# Patient Record
Sex: Male | Born: 1975 | Race: Black or African American | Hispanic: No | Marital: Single | Smoking: Current every day smoker
Health system: Southern US, Community
[De-identification: ages and names within clinical notes are randomized; demographics above are authoritative.]

## PROBLEM LIST (undated history)

## (undated) DIAGNOSIS — H269 Unspecified cataract: Secondary | ICD-10-CM

## (undated) DIAGNOSIS — M109 Gout, unspecified: Secondary | ICD-10-CM

## (undated) HISTORY — PX: EYE SURGERY: SHX253

## (undated) HISTORY — DX: Unspecified cataract: H26.9

## (undated) HISTORY — DX: Gout, unspecified: M10.9

---

## 2001-09-22 ENCOUNTER — Emergency Department (HOSPITAL_COMMUNITY): Admission: EM | Admit: 2001-09-22 | Discharge: 2001-09-22 | Payer: Self-pay | Admitting: Emergency Medicine

## 2012-06-09 ENCOUNTER — Ambulatory Visit: Payer: 59

## 2012-06-09 ENCOUNTER — Ambulatory Visit (INDEPENDENT_AMBULATORY_CARE_PROVIDER_SITE_OTHER): Payer: 59 | Admitting: Family Medicine

## 2012-06-09 VITALS — BP 101/69 | HR 84 | Temp 98.1°F | Resp 16 | Ht 65.25 in | Wt 234.0 lb

## 2012-06-09 DIAGNOSIS — R1012 Left upper quadrant pain: Secondary | ICD-10-CM

## 2012-06-09 DIAGNOSIS — M109 Gout, unspecified: Secondary | ICD-10-CM

## 2012-06-09 DIAGNOSIS — K5900 Constipation, unspecified: Secondary | ICD-10-CM

## 2012-06-09 DIAGNOSIS — R1084 Generalized abdominal pain: Secondary | ICD-10-CM

## 2012-06-09 LAB — POCT UA - MICROSCOPIC ONLY
Bacteria, U Microscopic: NEGATIVE
Casts, Ur, LPF, POC: NEGATIVE
Crystals, Ur, HPF, POC: NEGATIVE
Mucus, UA: NEGATIVE
Yeast, UA: NEGATIVE

## 2012-06-09 LAB — POCT URINALYSIS DIPSTICK
Bilirubin, UA: NEGATIVE
Glucose, UA: NEGATIVE
Ketones, UA: NEGATIVE
Nitrite, UA: NEGATIVE
Protein, UA: NEGATIVE
Spec Grav, UA: 1.015
Urobilinogen, UA: 0.2
pH, UA: 6

## 2012-06-09 LAB — POCT CBC
Granulocyte percent: 58.3 %G (ref 37–80)
HCT, POC: 55.5 % — AB (ref 43.5–53.7)
Hemoglobin: 18 g/dL (ref 14.1–18.1)
Lymph, poc: 2.4 (ref 0.6–3.4)
MCH, POC: 29.6 pg (ref 27–31.2)
MCHC: 32.4 g/dL (ref 31.8–35.4)
MCV: 91.3 fL (ref 80–97)
MID (cbc): 0.6 (ref 0–0.9)
MPV: 7.9 fL (ref 0–99.8)
POC Granulocyte: 4.2 (ref 2–6.9)
POC LYMPH PERCENT: 33.9 %L (ref 10–50)
POC MID %: 7.8 %M (ref 0–12)
Platelet Count, POC: 417 10*3/uL (ref 142–424)
RBC: 6.08 M/uL (ref 4.69–6.13)
RDW, POC: 13.8 %
WBC: 7.2 10*3/uL (ref 4.6–10.2)

## 2012-06-09 LAB — COMPREHENSIVE METABOLIC PANEL
ALT: 29 U/L (ref 0–53)
AST: 15 U/L (ref 0–37)
Albumin: 4.6 g/dL (ref 3.5–5.2)
Alkaline Phosphatase: 56 U/L (ref 39–117)
BUN: 8 mg/dL (ref 6–23)
CO2: 29 mEq/L (ref 19–32)
Calcium: 10.3 mg/dL (ref 8.4–10.5)
Chloride: 102 mEq/L (ref 96–112)
Creat: 1.09 mg/dL (ref 0.50–1.35)
Glucose, Bld: 91 mg/dL (ref 70–99)
Potassium: 4.5 mEq/L (ref 3.5–5.3)
Sodium: 139 mEq/L (ref 135–145)
Total Bilirubin: 0.6 mg/dL (ref 0.3–1.2)
Total Protein: 7.6 g/dL (ref 6.0–8.3)

## 2012-06-09 MED ORDER — HYOSCYAMINE SULFATE 0.125 MG SL SUBL: 0.1250 mg | SUBLINGUAL_TABLET | SUBLINGUAL | Status: DC | PRN

## 2012-06-09 MED ORDER — LACTULOSE 10 GM/15ML PO SOLN: 20.0000 g | Freq: Three times a day (TID) | ORAL | Status: AC

## 2012-06-09 NOTE — Progress Notes (Signed)
36 yo man with left upper quadrant pain x 3 days.  He tried some OTC liquid which did not help.   Had some nausea and vomiting at first. No prior h/o abdominal pain.  Does have constipation. Works at [employer]. Recently had cataract surgery on eye (Tuesday)  [pain in abdomen started later]  Objective:  NAD HEENT:  Right eye is patched Abdomen:  Obese, no HSM detected, mildly tender LUQ Chest: clear Heart: reg, no murmur [facility] reading (PRIMARY) by  Dr. Milus Glazier:  nonsp bowel gas pattern Results for orders placed in visit on 06/09/12  POCT UA - MICROSCOPIC ONLY      Component Value Range   WBC, Ur, HPF, POC 0-1     RBC, urine, microscopic 0-2     Bacteria, U Microscopic negative     Mucus, UA negative     Epithelial cells, urine per micros 0-2     Crystals, Ur, HPF, POC negative     Casts, Ur, LPF, POC negative     Yeast, UA negative    POCT CBC      Component Value Range   WBC 7.2  4.6 - 10.2 K/uL   Lymph, poc 2.4  0.6 - 3.4   POC LYMPH PERCENT 33.9  10 - 50 %L   MID (cbc) 0.6  0 - 0.9   POC MID % 7.8  0 - 12 %M   POC Granulocyte 4.2  2 - 6.9   Granulocyte percent 58.3  37 - 80 %G   RBC 6.08  4.69 - 6.13 M/uL   Hemoglobin 18.0  14.1 - 18.1 g/dL   HCT, POC 16.1 (*) 09.6 - 53.7 %   MCV 91.3  80 - 97 fL   MCH, POC 29.6  27 - 31.2 pg   MCHC 32.4  31.8 - 35.4 g/dL   RDW, POC 04.5     Platelet Count, POC 417  142 - 424 K/uL   MPV 7.9  0 - 99.8 fL    Assessment:  Obstipation Plan:   1. LUQ abdominal pain  DG Abd 1 View, POCT UA - Microscopic Only, POCT urinalysis dipstick, POCT CBC, Comprehensive metabolic panel, H. pylori antibody, IgG  2. Gout    3. Obstipation  hyoscyamine (LEVSIN/SL) 0.125 MG SL tablet, lactulose (CHRONULAC) 10 GM/15ML solution

## 2012-06-12 ENCOUNTER — Other Ambulatory Visit: Payer: Self-pay | Admitting: Family Medicine

## 2012-06-12 DIAGNOSIS — K279 Peptic ulcer, site unspecified, unspecified as acute or chronic, without hemorrhage or perforation: Secondary | ICD-10-CM

## 2012-06-12 LAB — H. PYLORI ANTIBODY, IGG: H Pylori IgG: 6.24 {ISR} — ABNORMAL HIGH

## 2012-06-12 MED ORDER — AMOXICILL-CLARITHRO-LANSOPRAZ PO MISC: Freq: Two times a day (BID) | ORAL | Status: DC

## 2012-07-13 ENCOUNTER — Ambulatory Visit (INDEPENDENT_AMBULATORY_CARE_PROVIDER_SITE_OTHER): Payer: 59 | Admitting: Physician Assistant

## 2012-07-13 VITALS — BP 132/98 | HR 88 | Temp 98.3°F | Resp 16 | Ht 65.5 in | Wt 241.0 lb

## 2012-07-13 DIAGNOSIS — M109 Gout, unspecified: Secondary | ICD-10-CM

## 2012-07-13 DIAGNOSIS — R52 Pain, unspecified: Secondary | ICD-10-CM

## 2012-07-13 MED ORDER — COLCHICINE 0.6 MG PO TABS: ORAL_TABLET | ORAL | Status: DC

## 2012-07-13 MED ORDER — PREDNISONE 20 MG PO TABS: ORAL_TABLET | ORAL | Status: AC

## 2012-07-13 NOTE — Progress Notes (Signed)
   Patient ID: Richard Berger MRN: [id number], DOB: June 11, 1976, 36 y.o. Date of Encounter: 07/13/2012, 10:09 AM  Primary Physician: Tally Due, MD  Chief Complaint: Gout flare  HPI: 36 y.o. year old male with history below presents with pain along the left great toe for 3 days. No injury or trauma. Symptoms began on 07/10/12. Patient with significant history of gout. Has been seen here numerous times for this. Has been off of his Allopurinol for about 6-8 months and had been doing well. He stopped eating beef all together. He did recently eat some shellfish along with some beer though. No radiation distally or proximally. Afebrile. No chills.    Past Medical History  Diagnosis Date  . Gout      Home Meds: Prior to Admission medications   Medication Sig Start Date End Date Taking? Authorizing Provider  None                Allergies: No Known Allergies  History   Social History  . Marital Status: Single    Spouse Name: N/A    Number of Children: N/A  . Years of Education: N/A   Occupational History  . Not on file.   Social History Main Topics  . Smoking status: Current Some Day Smoker    Types: Cigarettes  . Smokeless tobacco: Not on file  . Alcohol Use: Not on file  . Drug Use: Not on file  . Sexually Active: Not on file   Other Topics Concern  . Not on file   Social History Narrative  . No narrative on file     Review of Systems: Constitutional: negative for chills, fever, night sweats, or weight changes  HEENT: negative for vision changes or hearing loss Cardiovascular: negative for chest pain or palpitations Respiratory: negative for hemoptysis, wheezing, shortness of breath, or cough Abdominal: negative for abdominal pain, nausea, or vomiting Dermatological: see above Neurologic: negative for headache, dizziness, or syncope   Physical Exam: Blood pressure 132/98, pulse 88, temperature 98.3 F (36.8 C), temperature source Oral, resp. rate 16, height  5' 5.5" (1.664 m), weight 241 lb (109.317 kg), SpO2 99.00%., Body mass index is 39.49 kg/(m^2). General: Well developed, well nourished, in no acute distress. Head: Normocephalic, atraumatic, eyes without discharge, sclera non-icteric, nares are without discharge. Bilateral auditory canals clear, TM's are without perforation, pearly grey and translucent with reflective cone of light bilaterally. Oral cavity moist, posterior pharynx without exudate, erythema, peritonsillar abscess, or post nasal drip.  Neck: Supple. No thyromegaly. Full ROM. No lymphadenopathy. Lungs: Clear bilaterally to auscultation without wheezes, rales, or rhonchi. Breathing is unlabored. Heart: RRR with S1 S2. No murmurs, rubs, or gallops appreciated. Msk:  Strength and tone normal for age. Extremities/Skin: Left great toe erythematous and swollen with considerable TTP. Distal pulses 2+ and cap refill less than 2 seconds through out. Warm and dry. No clubbing or cyanosis. No edema. No rashes, wounds, or suspicious lesions. Neuro: Alert and oriented X 3. Moves all extremities spontaneously. Gait is normal. CNII-XII grossly in tact. Psych:  Responds to questions appropriately with a normal affect.     ASSESSMENT AND PLAN:  36 y.o. year old male with gout of left great toe. -Prednisone 20 mg #18 3x3, 2x3, 1x3 no RF -Colcrys 0.6 mg 2 po now, then 1 po 1 hour later #6 RF 1 -Diet discussed -RTC precautions  Signed, Eula Listen, PA-C 07/13/2012 10:09 AM

## 2012-08-10 ENCOUNTER — Ambulatory Visit: Payer: 59

## 2012-08-10 ENCOUNTER — Ambulatory Visit (INDEPENDENT_AMBULATORY_CARE_PROVIDER_SITE_OTHER): Payer: 59 | Admitting: Emergency Medicine

## 2012-08-10 VITALS — BP 122/76 | HR 84 | Temp 98.4°F | Resp 17 | Ht 65.0 in | Wt 239.0 lb

## 2012-08-10 DIAGNOSIS — K5900 Constipation, unspecified: Secondary | ICD-10-CM

## 2012-08-10 DIAGNOSIS — R109 Unspecified abdominal pain: Secondary | ICD-10-CM

## 2012-08-10 NOTE — Patient Instructions (Addendum)
Constipation, Adult  Constipation is when a person:   Poops (bowel movement) less than 3 times a week.   Has a hard time pooping.   Has poop that is dry, hard, or bigger than normal.  HOME CARE    Eat more fiber, such as fruits, vegetables, whole grains like brown rice, and beans.   Eat less fatty foods and sugar. This includes French fries, hamburgers, cookies, candy, and soda.   If you are not getting enough fiber from food, take products with added fiber in them (supplements).   Drink enough fluid to keep your pee (urine) clear or pale yellow.   Go to the restroom when you feel like you need to poop. Do not hold it.   Only take medicine as told by your doctor. Do not take medicines that help you poop (laxatives) without talking to your doctor first.   Exercise on a regular basis, or as told by your doctor.  GET HELP RIGHT AWAY IF:    You have bright red blood in your poop (stool).   Your constipation lasts more than 4 days or gets worse.   You have belly (abdomen) or butt (rectal) pain.   You have thin poop (as thin as a pencil).   You lose weight, and it cannot be explained.  MAKE SURE YOU:    Understand these instructions.   Will watch your condition.   Will get help right away if you are not doing well or get worse.  Document Released: 04/12/2008 Document Revised: 01/17/2012 Document Reviewed: 09/28/2011  [employer] Patient Information 2013 [employer].

## 2012-08-10 NOTE — Progress Notes (Signed)
  Subjective:    Patient ID: Richard Berger, male    DOB: 02-10-1976, 36 y.o.   MRN: [id number]  HPI patient presents with a history that he has been unable to have a bowel movement the last 2 weeks. He's been taking MiraLax on a regular basis without success. He has no previous history of constipation this is a new problem for him he has not had in the past.    Review of Systems     Objective:   Physical Exam patient is alert cooperative not in any distress his abdomen is soft there are no areas of tenderness his rectal exam did not reveal any stool in the rectal vault  [facility] reading (PRIMARY) by  Dr. Christeen Douglas has a nonspecific bowel gas pattern with no evidence of obstruction.         Assessment & Plan:   We'll treat with Dulcolax along with Dulcolax suppositories and information given regarding constipation he is going to take 2 Dulcolax tablets now and repeat tonight. If no bowel movement he will take 2 Dulcolax in the morning and 2 tomorrow night. He is to use a Dulcolax suppository now repeat tonight and repeat in the morning and evening tomorrow if he does not get results. Information was given regarding treatment of constipation.

## 2012-08-26 ENCOUNTER — Ambulatory Visit (INDEPENDENT_AMBULATORY_CARE_PROVIDER_SITE_OTHER): Payer: 59 | Admitting: Family Medicine

## 2012-08-26 VITALS — BP 118/70 | HR 102 | Temp 98.2°F | Resp 16 | Ht 65.0 in | Wt 245.0 lb

## 2012-08-26 DIAGNOSIS — R52 Pain, unspecified: Secondary | ICD-10-CM

## 2012-08-26 DIAGNOSIS — M79673 Pain in unspecified foot: Secondary | ICD-10-CM

## 2012-08-26 DIAGNOSIS — M79609 Pain in unspecified limb: Secondary | ICD-10-CM

## 2012-08-26 DIAGNOSIS — M109 Gout, unspecified: Secondary | ICD-10-CM

## 2012-08-26 LAB — COMPREHENSIVE METABOLIC PANEL
ALT: 35 U/L (ref 0–53)
BUN: 10 mg/dL (ref 6–23)
CO2: 26 mEq/L (ref 19–32)
Calcium: 9.5 mg/dL (ref 8.4–10.5)
Chloride: 102 mEq/L (ref 96–112)
Creat: 1.09 mg/dL (ref 0.50–1.35)
Glucose, Bld: 92 mg/dL (ref 70–99)
Total Bilirubin: 0.5 mg/dL (ref 0.3–1.2)

## 2012-08-26 LAB — POCT CBC
Granulocyte percent: 56.1 %G (ref 37–80)
MID (cbc): 0.6 (ref 0–0.9)
MPV: 8 fL (ref 0–99.8)
POC Granulocyte: 4.7 (ref 2–6.9)
POC MID %: 7.5 %M (ref 0–12)
Platelet Count, POC: 385 10*3/uL (ref 142–424)
RBC: 5.68 M/uL (ref 4.69–6.13)

## 2012-08-26 LAB — URIC ACID: Uric Acid, Serum: 9.4 mg/dL — ABNORMAL HIGH (ref 4.0–7.8)

## 2012-08-26 MED ORDER — COLCHICINE 0.6 MG PO TABS: ORAL_TABLET | ORAL | Status: DC

## 2012-08-26 MED ORDER — PREDNISONE 20 MG PO TABS: ORAL_TABLET | ORAL | Status: DC

## 2012-08-26 NOTE — Progress Notes (Signed)
[address]   [place], [place]  [address]   [phone]  Subjective:    Patient ID: Richard Berger, male    DOB: 01/19/1976, 36 y.o.   MRN: [id number]  HPI This 36 y.o. male presents for evaluation of foot pain.  Ate shrimp five days ago; onset of L foot pain three days ago.  Pain located in L ankle.  +swelling; +warm to touch; +hot to touch.  No beer; rare beer intake.  No recent beer; last beer intake 2 weeks ago.  No injury.  Feels like typical gout.  History of gout a long time ago.  No longer eating beef.  Has taken Colcrys x 2 yesterday.  Previously took Allopurinol daily for prevention.  Trying to change diet to prevent recurrence.  Pain feels like typical gouty pain.  Prednisone worked well in 07/2012.  Pain with weight bearing.   PMH:  Gout Psurg: Cataract Surgery Social: single; one son in [place].  Social tobacco; social alcohol.  Moved to [place] in 2002 from [place].     Review of Systems  Constitutional: Negative for fever, chills, diaphoresis and fatigue.  Musculoskeletal: Positive for myalgias, joint swelling and arthralgias.  Skin: Negative for color change and rash.  Neurological: Negative for weakness and numbness.    Past Medical History  Diagnosis Date  . Gout     Past Surgical History  Procedure Date  . Eye surgery     Cataract    Prior to Admission medications   Medication Sig Start Date End Date Taking? Authorizing Provider  colchicine 0.6 MG tablet One po tid for 5 days with onset of gouty pain 08/26/12  Yes Ethelda Chick, MD  predniSONE (DELTASONE) 20 MG tablet 3 tablets daily x 1 day, then two tablets daily x 5 days, then one tablet daily x 5 days 08/26/12   Ethelda Chick, MD    No Known Allergies  History   Social History  . Marital Status: Single    Spouse Name: N/A    Number of Children: N/A  . Years of Education: N/A   Occupational History  . Not on file.   Social History Main Topics  . Smoking status: Current Some Day Smoker     Types: Cigarettes  . Smokeless tobacco: Not on file  . Alcohol Use: 2.4 oz/week    2 Glasses of wine, 2 Cans of beer per week  . Drug Use: Not on file  . Sexually Active: Not on file   Other Topics Concern  . Not on file   Social History Narrative   Marital status: single; dating. Moved from [place] to [place] in 2002.  Family in [place].   Children: one son in [place].   Employment: employed   Tobacco: daily tobacco use.   Alcohol: alcohol on weekends.      No family history on file.     Objective:   Physical Exam  Constitutional: He appears well-developed and well-nourished. No distress.  Cardiovascular: Normal rate, regular rhythm, normal heart sounds and intact distal pulses.   No murmur heard. Musculoskeletal: He exhibits edema and tenderness.       Left foot: He exhibits decreased range of motion, tenderness, bony tenderness and swelling. He exhibits normal capillary refill, no crepitus, no deformity and no laceration.       L FOOT:  MILD TO MODERATE SWELLING; WARMTH ALONG METATARSALS AND TARSAL BONES; +TTP DIFFUSELY TARSALS AND METATARSALS; PAIN WITH ROM  L ANKLE.  FULL ROM TOES DIFFUSELY.    Skin: He is not diaphoretic.   Results for orders placed in visit on 08/26/12  POCT CBC      Component Value Range   WBC 8.3  4.6 - 10.2 K/uL   Lymph, poc 3.0  0.6 - 3.4   POC LYMPH PERCENT 36.4  10 - 50 %L   MID (cbc) 0.6  0 - 0.9   POC MID % 7.5  0 - 12 %M   POC Granulocyte 4.7  2 - 6.9   Granulocyte percent 56.1  37 - 80 %G   RBC 5.68  4.69 - 6.13 M/uL   Hemoglobin 16.8  14.1 - 18.1 g/dL   HCT, POC 40.9  81.1 - 53.7 %   MCV 91.5  80 - 97 fL   MCH, POC 29.6  27 - 31.2 pg   MCHC 32.3  31.8 - 35.4 g/dL   RDW, POC 91.4     Platelet Count, POC 385  142 - 424 K/uL   MPV 8.0  0 - 99.8 fL       Assessment & Plan:   1. Foot pain  POCT CBC, Comprehensive metabolic panel  2. Gout  POCT SEDIMENTATION RATE, Uric Acid    1. L Foot Pain:  New.  Secondary to acute  gouty attack.   2.  Gouty Attack L Foot:  Recurrent/new to this provider.  Rx for Prednisone provided; refill of Colcrys provided and educated on use.  Obtain labs. Pt declined xray and appropriate unless does not improve with current rx.  Post-operative shoe provided and fitted; pt declined crutches.  Advised to avoid seafood and red meat; advised to limit alcohol.

## 2012-08-26 NOTE — Patient Instructions (Addendum)
1. Foot pain  POCT CBC, Comprehensive metabolic panel  2. Gout  POCT SEDIMENTATION RATE, Uric Acid, predniSONE (DELTASONE) 20 MG tablet   Gout Gout is an inflammatory condition (arthritis) caused by a buildup of uric acid crystals in the joints. Uric acid is a chemical that is normally present in the blood. Under some circumstances, uric acid can form into crystals in your joints. This causes joint redness, soreness, and swelling (inflammation). Repeat attacks are common. Over time, uric acid crystals can form into masses (tophi) near a joint, causing disfigurement. Gout is treatable and often preventable. CAUSES  The disease begins with elevated levels of uric acid in the blood. Uric acid is produced by your body when it breaks down a naturally found substance called purines. This also happens when you eat certain foods such as meats and fish. Causes of an elevated uric acid level include:  Being passed down from parent to child (heredity).  Diseases that cause increased uric acid production (obesity, psoriasis, some cancers).  Excessive alcohol use.  Diet, especially diets rich in meat and seafood.  Medicines, including certain cancer-fighting drugs (chemotherapy), diuretics, and aspirin.  Chronic kidney disease. The kidneys are no longer able to remove uric acid well.  Problems with metabolism. Conditions strongly associated with gout include:  Obesity.  High blood pressure.  High cholesterol.  Diabetes. Not everyone with elevated uric acid levels gets gout. It is not understood why some people get gout and others do not. Surgery, joint injury, and eating too much of certain foods are some of the factors that can lead to gout. SYMPTOMS   An attack of gout comes on quickly. It causes intense pain with redness, swelling, and warmth in a joint.  Fever can occur.  Often, only one joint is involved. Certain joints are more commonly involved:  Base of the big  toe.  Knee.  Ankle.  Wrist.  Finger. Without treatment, an attack usually goes away in a few days to weeks. Between attacks, you usually will not have symptoms, which is different from many other forms of arthritis. DIAGNOSIS  Your caregiver will suspect gout based on your symptoms and exam. Removal of fluid from the joint (arthrocentesis) is done to check for uric acid crystals. Your caregiver will give you a medicine that numbs the area (local anesthetic) and use a needle to remove joint fluid for exam. Gout is confirmed when uric acid crystals are seen in joint fluid, using a special microscope. Sometimes, blood, urine, and X-ray tests are also used. TREATMENT  There are 2 phases to gout treatment: treating the sudden onset (acute) attack and preventing attacks (prophylaxis). Treatment of an Acute Attack  Medicines are used. These include anti-inflammatory medicines or steroid medicines.  An injection of steroid medicine into the affected joint is sometimes necessary.  The painful joint is rested. Movement can worsen the arthritis.  You may use warm or cold treatments on painful joints, depending which works best for you.  Discuss the use of coffee, vitamin C, or cherries with your caregiver. These may be helpful treatment options. Treatment to Prevent Attacks After the acute attack subsides, your caregiver may advise prophylactic medicine. These medicines either help your kidneys eliminate uric acid from your body or decrease your uric acid production. You may need to stay on these medicines for a very long time. The early phase of treatment with prophylactic medicine can be associated with an increase in acute gout attacks. For this reason, during the first few  months of treatment, your caregiver may also advise you to take medicines usually used for acute gout treatment. Be sure you understand your caregiver's directions. You should also discuss dietary treatment with your  caregiver. Certain foods such as meats and fish can increase uric acid levels. Other foods such as dairy can decrease levels. Your caregiver can give you a list of foods to avoid. HOME CARE INSTRUCTIONS   Do not take aspirin to relieve pain. This raises uric acid levels.  Only take over-the-counter or prescription medicines for pain, discomfort, or fever as directed by your caregiver.  Rest the joint as much as possible. When in bed, keep sheets and blankets off painful areas.  Keep the affected joint raised (elevated).  Use crutches if the painful joint is in your leg.  Drink enough water and fluids to keep your urine clear or pale yellow. This helps your body get rid of uric acid. Do not drink alcoholic beverages. They slow the passage of uric acid.  Follow your caregiver's dietary instructions. Pay careful attention to the amount of protein you eat. Your daily diet should emphasize fruits, vegetables, whole grains, and fat-free or low-fat milk products.  Maintain a healthy body weight. SEEK MEDICAL CARE IF:   You have an oral temperature above 102 F (38.9 C).  You develop diarrhea, vomiting, or any side effects from medicines.  You do not feel better in 24 hours, or you are getting worse. SEEK IMMEDIATE MEDICAL CARE IF:   Your joint becomes suddenly more tender and you have:  Chills.  An oral temperature above 102 F (38.9 C), not controlled by medicine. MAKE SURE YOU:   Understand these instructions.  Will watch your condition.  Will get help right away if you are not doing well or get worse. Document Released: 10/22/2000 Document Revised: 01/17/2012 Document Reviewed: 02/02/2010 [facility] Patient Information 2013 [place], [place].

## 2012-09-06 NOTE — Progress Notes (Signed)
Reviewed and agree.

## 2012-09-10 ENCOUNTER — Other Ambulatory Visit: Payer: Self-pay | Admitting: Radiology

## 2012-09-10 MED ORDER — ALLOPURINOL 100 MG PO TABS: 100.0000 mg | ORAL_TABLET | Freq: Every day | ORAL | Status: DC

## 2012-09-14 ENCOUNTER — Telehealth: Payer: Self-pay

## 2012-09-14 NOTE — Telephone Encounter (Signed)
Explained to pt that he should RTC to have xray done if he is still in a lot of pain. Pt stated that the prednisone really helped him but he is almost out of it. Advised pt that it is used for a short period of time to decrease inflammation, but is generally not RFd. Pt verbalized understanding that he should RTC if pain continues. Pt asked if we had sent in the allopurinol Rx in for his gout and when I advised it was sent in on 11/3, he stated he would p/up and thanked [place].

## 2012-09-14 NOTE — Telephone Encounter (Signed)
Chart note indicates he may need xray if not better, have called him to advise he needs to return to clinic since his is still having such pain. Left message for him to call back.

## 2012-09-14 NOTE — Telephone Encounter (Signed)
Pt states he was given rx for pain medication two to three days ago and the medication is not strong enough and would like a rx for stronger medication. Please contact pt to advise. [phone]

## 2012-10-02 ENCOUNTER — Ambulatory Visit (INDEPENDENT_AMBULATORY_CARE_PROVIDER_SITE_OTHER): Payer: 59 | Admitting: Family Medicine

## 2012-10-02 VITALS — BP 115/76 | HR 99 | Temp 98.4°F | Resp 16 | Ht 66.0 in | Wt 249.0 lb

## 2012-10-02 DIAGNOSIS — R52 Pain, unspecified: Secondary | ICD-10-CM

## 2012-10-02 DIAGNOSIS — M109 Gout, unspecified: Secondary | ICD-10-CM

## 2012-10-02 MED ORDER — COLCHICINE 0.6 MG PO TABS: ORAL_TABLET | ORAL | Status: DC

## 2012-10-02 MED ORDER — PREDNISONE 20 MG PO TABS: ORAL_TABLET | ORAL | Status: DC

## 2012-10-02 MED ORDER — ALLOPURINOL 100 MG PO TABS: ORAL_TABLET | ORAL | Status: DC

## 2012-10-02 NOTE — Progress Notes (Signed)
Subjective: 36 year old male from [place] who has a history of recurrences of gout. He was last treated a month ago. Although he is taken some allopurinol, he has not been pushed sit to the point that his uric acid level was low enough. I told me to get down below 6 and leave it there. He always gets in his left ankle, and sometimes it can come down to the toes of his left foot. He has not had any other joints.  Objective: Obese for African male in no major distress except for when he tries to move or walk in his foot hurts a lot he has a little effusion in the ankle. Very tender.  Assessment: Acute gouty arthritis Pain foot  Plan: Prednisone and colchicine. Begin allopurinol in one week. Return in 1 month. Goal uric acid is less than 6. I did not repeat labs today. Thank you

## 2012-10-02 NOTE — Patient Instructions (Signed)
Take prednisone as directed on the bottle  Take colchicine as directed on the bottle. If he gives you diarrhea cut the dose back  Wait about one week or until the gout is doing better and then begin allopurinol 100 mg one daily for one week then increase to 2 daily for the prevention of recurrences of gout.  Return in one month for me to recheck labs     Gout Gout is an inflammatory condition (arthritis) caused by a buildup of uric acid crystals in the joints. Uric acid is a chemical that is normally present in the blood. Under some circumstances, uric acid can form into crystals in your joints. This causes joint redness, soreness, and swelling (inflammation). Repeat attacks are common. Over time, uric acid crystals can form into masses (tophi) near a joint, causing disfigurement. Gout is treatable and often preventable. CAUSES  The disease begins with elevated levels of uric acid in the blood. Uric acid is produced by your body when it breaks down a naturally found substance called purines. This also happens when you eat certain foods such as meats and fish. Causes of an elevated uric acid level include:  Being passed down from parent to child (heredity).  Diseases that cause increased uric acid production (obesity, psoriasis, some cancers).  Excessive alcohol use.  Diet, especially diets rich in meat and seafood.  Medicines, including certain cancer-fighting drugs (chemotherapy), diuretics, and aspirin.  Chronic kidney disease. The kidneys are no longer able to remove uric acid well.  Problems with metabolism. Conditions strongly associated with gout include:  Obesity.  High blood pressure.  High cholesterol.  Diabetes. Not everyone with elevated uric acid levels gets gout. It is not understood why some people get gout and others do not. Surgery, joint injury, and eating too much of certain foods are some of the factors that can lead to gout. SYMPTOMS   An attack of gout  comes on quickly. It causes intense pain with redness, swelling, and warmth in a joint.  Fever can occur.  Often, only one joint is involved. Certain joints are more commonly involved:  Base of the big toe.  Knee.  Ankle.  Wrist.  Finger. Without treatment, an attack usually goes away in a few days to weeks. Between attacks, you usually will not have symptoms, which is different from many other forms of arthritis. DIAGNOSIS  Your caregiver will suspect gout based on your symptoms and exam. Removal of fluid from the joint (arthrocentesis) is done to check for uric acid crystals. Your caregiver will give you a medicine that numbs the area (local anesthetic) and use a needle to remove joint fluid for exam. Gout is confirmed when uric acid crystals are seen in joint fluid, using a special microscope. Sometimes, blood, urine, and X-ray tests are also used. TREATMENT  There are 2 phases to gout treatment: treating the sudden onset (acute) attack and preventing attacks (prophylaxis). Treatment of an Acute Attack  Medicines are used. These include anti-inflammatory medicines or steroid medicines.  An injection of steroid medicine into the affected joint is sometimes necessary.  The painful joint is rested. Movement can worsen the arthritis.  You may use warm or cold treatments on painful joints, depending which works best for you.  Discuss the use of coffee, vitamin C, or cherries with your caregiver. These may be helpful treatment options. Treatment to Prevent Attacks After the acute attack subsides, your caregiver may advise prophylactic medicine. These medicines either help your kidneys eliminate uric  acid from your body or decrease your uric acid production. You may need to stay on these medicines for a very long time. The early phase of treatment with prophylactic medicine can be associated with an increase in acute gout attacks. For this reason, during the first few months of  treatment, your caregiver may also advise you to take medicines usually used for acute gout treatment. Be sure you understand your caregiver's directions. You should also discuss dietary treatment with your caregiver. Certain foods such as meats and fish can increase uric acid levels. Other foods such as dairy can decrease levels. Your caregiver can give you a list of foods to avoid. HOME CARE INSTRUCTIONS   Do not take aspirin to relieve pain. This raises uric acid levels.  Only take over-the-counter or prescription medicines for pain, discomfort, or fever as directed by your caregiver.  Rest the joint as much as possible. When in bed, keep sheets and blankets off painful areas.  Keep the affected joint raised (elevated).  Use crutches if the painful joint is in your leg.  Drink enough water and fluids to keep your urine clear or pale yellow. This helps your body get rid of uric acid. Do not drink alcoholic beverages. They slow the passage of uric acid.  Follow your caregiver's dietary instructions. Pay careful attention to the amount of protein you eat. Your daily diet should emphasize fruits, vegetables, whole grains, and fat-free or low-fat milk products.  Maintain a healthy body weight. SEEK MEDICAL CARE IF:   You have an oral temperature above 102 F (38.9 C).  You develop diarrhea, vomiting, or any side effects from medicines.  You do not feel better in 24 hours, or you are getting worse. SEEK IMMEDIATE MEDICAL CARE IF:   Your joint becomes suddenly more tender and you have:  Chills.  An oral temperature above 102 F (38.9 C), not controlled by medicine. MAKE SURE YOU:   Understand these instructions.  Will watch your condition.  Will get help right away if you are not doing well or get worse. Document Released: 10/22/2000 Document Revised: 01/17/2012 Document Reviewed: 02/02/2010 [facility] Patient Information 2013 [place], [place].

## 2012-10-14 ENCOUNTER — Ambulatory Visit (INDEPENDENT_AMBULATORY_CARE_PROVIDER_SITE_OTHER): Payer: 59 | Admitting: Internal Medicine

## 2012-10-14 VITALS — BP 104/72 | HR 87 | Temp 98.0°F | Resp 24 | Ht 65.0 in | Wt 246.6 lb

## 2012-10-14 DIAGNOSIS — R05 Cough: Secondary | ICD-10-CM

## 2012-10-14 DIAGNOSIS — M109 Gout, unspecified: Secondary | ICD-10-CM

## 2012-10-14 MED ORDER — PREDNISONE 20 MG PO TABS: ORAL_TABLET | ORAL | Status: DC

## 2012-10-14 MED ORDER — AZITHROMYCIN 250 MG PO TABS: ORAL_TABLET | ORAL | Status: DC

## 2012-10-14 NOTE — Progress Notes (Signed)
  Subjective:    Patient ID: Richard Berger, male    DOB: 1976-01-27, 36 y.o.   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 predniSONE (DELTASONE) 20 MG tablet    Sig: 3/3/2/2/1/1 single daily dose for 6 days    Dispense:  12 tablet    Refill:  0  . azithromycin (ZITHROMAX) 250 MG tablet    Sig: As z pak    Dispense:  6 tablet    Refill:  0

## 2012-12-02 ENCOUNTER — Ambulatory Visit (INDEPENDENT_AMBULATORY_CARE_PROVIDER_SITE_OTHER): Payer: 59 | Admitting: Emergency Medicine

## 2012-12-02 VITALS — BP 119/79 | HR 83 | Temp 98.5°F | Resp 17 | Ht 65.0 in | Wt 250.0 lb

## 2012-12-02 DIAGNOSIS — J029 Acute pharyngitis, unspecified: Secondary | ICD-10-CM

## 2012-12-02 DIAGNOSIS — M109 Gout, unspecified: Secondary | ICD-10-CM

## 2012-12-02 MED ORDER — COLCHICINE 0.6 MG PO TABS: ORAL_TABLET | ORAL | Status: DC

## 2012-12-02 MED ORDER — FIRST-DUKES MOUTHWASH MT SUSP: 10.0000 mL | OROMUCOSAL | Status: DC | PRN

## 2012-12-02 MED ORDER — INDOMETHACIN 25 MG PO CAPS: 25.0000 mg | ORAL_CAPSULE | Freq: Two times a day (BID) | ORAL | Status: DC

## 2012-12-02 NOTE — Patient Instructions (Addendum)
Gout Gout is an inflammatory condition (arthritis) caused by a buildup of uric acid crystals in the joints. Uric acid is a chemical that is normally present in the blood. Under some circumstances, uric acid can form into crystals in your joints. This causes joint redness, soreness, and swelling (inflammation). Repeat attacks are common. Over time, uric acid crystals can form into masses (tophi) near a joint, causing disfigurement. Gout is treatable and often preventable. CAUSES  The disease begins with elevated levels of uric acid in the blood. Uric acid is produced by your body when it breaks down a naturally found substance called purines. This also happens when you eat certain foods such as meats and fish. Causes of an elevated uric acid level include:  Being passed down from parent to child (heredity).  Diseases that cause increased uric acid production (obesity, psoriasis, some cancers).  Excessive alcohol use.  Diet, especially diets rich in meat and seafood.  Medicines, including certain cancer-fighting drugs (chemotherapy), diuretics, and aspirin.  Chronic kidney disease. The kidneys are no longer able to remove uric acid well.  Problems with metabolism. Conditions strongly associated with gout include:  Obesity.  High blood pressure.  High cholesterol.  Diabetes. Not everyone with elevated uric acid levels gets gout. It is not understood why some people get gout and others do not. Surgery, joint injury, and eating too much of certain foods are some of the factors that can lead to gout. SYMPTOMS   An attack of gout comes on quickly. It causes intense pain with redness, swelling, and warmth in a joint.  Fever can occur.  Often, only one joint is involved. Certain joints are more commonly involved:  Base of the big toe.  Knee.  Ankle.  Wrist.  Finger. Without treatment, an attack usually goes away in a few days to weeks. Between attacks, you usually will not have  symptoms, which is different from many other forms of arthritis. DIAGNOSIS  Your caregiver will suspect gout based on your symptoms and exam. Removal of fluid from the joint (arthrocentesis) is done to check for uric acid crystals. Your caregiver will give you a medicine that numbs the area (local anesthetic) and use a needle to remove joint fluid for exam. Gout is confirmed when uric acid crystals are seen in joint fluid, using a special microscope. Sometimes, blood, urine, and X-ray tests are also used. TREATMENT  There are 2 phases to gout treatment: treating the sudden onset (acute) attack and preventing attacks (prophylaxis). Treatment of an Acute Attack  Medicines are used. These include anti-inflammatory medicines or steroid medicines.  An injection of steroid medicine into the affected joint is sometimes necessary.  The painful joint is rested. Movement can worsen the arthritis.  You may use warm or cold treatments on painful joints, depending which works best for you.  Discuss the use of coffee, vitamin C, or cherries with your caregiver. These may be helpful treatment options. Treatment to Prevent Attacks After the acute attack subsides, your caregiver may advise prophylactic medicine. These medicines either help your kidneys eliminate uric acid from your body or decrease your uric acid production. You may need to stay on these medicines for a very long time. The early phase of treatment with prophylactic medicine can be associated with an increase in acute gout attacks. For this reason, during the first few months of treatment, your caregiver may also advise you to take medicines usually used for acute gout treatment. Be sure you understand your caregiver's directions.   You should also discuss dietary treatment with your caregiver. Certain foods such as meats and fish can increase uric acid levels. Other foods such as dairy can decrease levels. Your caregiver can give you a list of foods  to avoid. HOME CARE INSTRUCTIONS   Do not take aspirin to relieve pain. This raises uric acid levels.  Only take over-the-counter or prescription medicines for pain, discomfort, or fever as directed by your caregiver.  Rest the joint as much as possible. When in bed, keep sheets and blankets off painful areas.  Keep the affected joint raised (elevated).  Use crutches if the painful joint is in your leg.  Drink enough water and fluids to keep your urine clear or pale yellow. This helps your body get rid of uric acid. Do not drink alcoholic beverages. They slow the passage of uric acid.  Follow your caregiver's dietary instructions. Pay careful attention to the amount of protein you eat. Your daily diet should emphasize fruits, vegetables, whole grains, and fat-free or low-fat milk products.  Maintain a healthy body weight. SEEK MEDICAL CARE IF:   You have an oral temperature above 102 F (38.9 C).  You develop diarrhea, vomiting, or any side effects from medicines.  You do not feel better in 24 hours, or you are getting worse. SEEK IMMEDIATE MEDICAL CARE IF:   Your joint becomes suddenly more tender and you have:  Chills.  An oral temperature above 102 F (38.9 C), not controlled by medicine. MAKE SURE YOU:   Understand these instructions.  Will watch your condition.  Will get help right away if you are not doing well or get worse. Document Released: 10/22/2000 Document Revised: 01/17/2012 Document Reviewed: 02/02/2010 [employer] Patient Information 2013 [employer].    

## 2012-12-02 NOTE — Progress Notes (Signed)
[facility] [address] [place] [address] [phone]  Date:  12/02/2012   Name:  Richard Berger   DOB:  11-28-1975   MRN:  [id number]  PCP:  Tally Due, MD    Chief Complaint: Lt foot pain and Sore Throat   History of Present Illness:  Richard Berger is a 37 y.o. very pleasant male patient who presents with the following:  History of gout.  Has increased pain and swelling three days ago.  Now has difficulty bearing weight.  No history of injury or overuse.  Not on medications.    Sore throat since three days ago.  No fever or chills, cough or coryza.  No nausea or vomiting.  Able to eat with no difficulty.  Ate sardines three days ago and had bones stuck in the back of his throat and teeth.  That is the origin of his sore throat  Patient Active Problem List  Diagnosis  . Gout    Past Medical History  Diagnosis Date  . Gout   . Gout     Past Surgical History  Procedure Date  . Eye surgery     Cataract    History  Substance Use Topics  . Smoking status: Current Some Day Smoker    Types: Cigarettes  . Smokeless tobacco: Never Used  . Alcohol Use: 2.4 oz/week    2 Glasses of wine, 2 Cans of beer per week    No family history on file.  No Known Allergies  Medication list has been reviewed and updated.  Current Outpatient Prescriptions on File Prior to Visit  Medication Sig Dispense Refill  . allopurinol (ZYLOPRIM) 100 MG tablet After gout is improved, began one daily. After one week increase to 2 daily  60 tablet  3  . azithromycin (ZITHROMAX) 250 MG tablet As z pak  6 tablet  0  . colchicine 0.6 MG tablet One po tid for 5 days then one daily tell pain is improved  30 tablet  3  . predniSONE (DELTASONE) 20 MG tablet 3/3/2/2/1/1 single daily dose for 6 days  12 tablet  0    Review of Systems:  As per HPI, otherwise negative.    Physical Examination: Filed Vitals:   12/02/12 1721  BP: 119/79  Pulse: 83  Temp: 98.5 F  (36.9 C)  Resp: 17   Filed Vitals:   12/02/12 1721  Height: 5\' 5"  (1.651 m)  Weight: 250 lb (113.399 kg)   Body mass index is 41.60 kg/(m^2). Ideal Body Weight: Weight in (lb) to have BMI = 25: 149.9    GEN: WDWN, NAD, Non-toxic, Alert & Oriented x 3 HEENT: Atraumatic, Normocephalic. Oropharynx:  Roof of mouth denuded in one strip.  No exudate or visible foreign body. Ears and Nose: No external deformity.  TM negative EXTR: No clubbing/cyanosis/edema.  Gout on left great toe NEURO: Normal gait.  PSYCH: Normally interactive. Conversant. Not depressed or anxious appearing.  Calm demeanor.    Assessment and Plan: Pharyngitis [facility] magic mouthwash Gout Indocin colcrys Follow up as needed  Carmelina Dane, MD

## 2012-12-03 NOTE — Progress Notes (Signed)
Reviewed and agree.

## 2013-01-02 ENCOUNTER — Telehealth: Payer: Self-pay | Admitting: *Deleted

## 2013-01-02 NOTE — Telephone Encounter (Signed)
[facility] on indomethacin 25mg .  Last fill 12/02/12

## 2013-01-04 MED ORDER — INDOMETHACIN 25 MG PO CAPS: 25.0000 mg | ORAL_CAPSULE | Freq: Two times a day (BID) | ORAL | Status: DC

## 2013-01-04 NOTE — Telephone Encounter (Signed)
Once this gout flare is resolved, increase the Allopurinol to 300 mg daily.  Then RTC 3-4 weeks after, for re-evaluation.

## 2013-01-04 NOTE — Telephone Encounter (Signed)
Called patient. He is taking the Allopurinol and is still having flare ups on the Allopurinol. He is taking 200 mg. I have told him to keep taking this, and we will let him know if this needs to be increased.

## 2013-01-04 NOTE — Telephone Encounter (Signed)
I have refilled indomethacin.  Is he still taking allopurinol?  Seems like he has had several gout flares in the last few months

## 2013-01-05 ENCOUNTER — Encounter: Payer: Self-pay | Admitting: Radiology

## 2013-01-05 MED ORDER — ALLOPURINOL 300 MG PO TABS: 300.0000 mg | ORAL_TABLET | Freq: Every day | ORAL | Status: DC

## 2013-01-05 NOTE — Telephone Encounter (Signed)
Called him, there is a language barrier noted from our conversation yesterday so I want to discuss with him to make sure he understands.

## 2013-01-11 NOTE — Telephone Encounter (Signed)
Spoke w/pt who reported that he is over his gout flare-up. I explained the new stronger strength Rx for Allopurinol that he needs to p/up and start taking QD instead of the 200 mg tablet he has been taking. Also advised him to RTC in about 3 weeks to recheck. Answered pt's questions until pt voiced understanding and agreed.

## 2013-01-22 ENCOUNTER — Ambulatory Visit (INDEPENDENT_AMBULATORY_CARE_PROVIDER_SITE_OTHER): Payer: 59 | Admitting: Physician Assistant

## 2013-01-22 VITALS — BP 152/84 | HR 112 | Temp 98.4°F | Resp 17 | Ht 65.5 in | Wt 255.0 lb

## 2013-01-22 DIAGNOSIS — J329 Chronic sinusitis, unspecified: Secondary | ICD-10-CM

## 2013-01-22 DIAGNOSIS — R05 Cough: Secondary | ICD-10-CM

## 2013-01-22 MED ORDER — GUAIFENESIN ER 1200 MG PO TB12: 1.0000 | ORAL_TABLET | Freq: Two times a day (BID) | ORAL | Status: DC | PRN

## 2013-01-22 MED ORDER — AMOXICILLIN 875 MG PO TABS: 1750.0000 mg | ORAL_TABLET | Freq: Two times a day (BID) | ORAL | Status: DC

## 2013-01-22 MED ORDER — IPRATROPIUM BROMIDE 0.03 % NA SOLN: 2.0000 | Freq: Two times a day (BID) | NASAL | Status: DC

## 2013-01-22 MED ORDER — BENZONATATE 100 MG PO CAPS: 100.0000 mg | ORAL_CAPSULE | Freq: Three times a day (TID) | ORAL | Status: DC | PRN

## 2013-01-22 NOTE — Progress Notes (Signed)
  Subjective:    Patient ID: Richard Berger, male    DOB: Apr 27, 1976, 37 y.o.   MRN: [id number]  HPI This 37 y.o. male presents for evaluation of 1+ weeks of sinus congestion and pressure, post-nasal drainage, cough.  Also sneezing and HA.  No dizziness, sore throat, GI/GU symptoms.  No fever, chills.  No known sick contacts.  His mother died 4 days ago, of "sickness."  He needs to go to work to earn money for her funeral, stating that she is currently "still in the mortuary."   Past Medical History  Diagnosis Date  . Gout     Past Surgical History  Procedure Laterality Date  . Eye surgery Right     Cataract    Prior to Admission medications   Medication Sig Start Date End Date Taking? Authorizing Provider  colchicine 0.6 MG tablet One po tid for 5 days then one daily tell pain is improved 10/02/12   Peyton Najjar, MD   No Known Allergies  History   Social History  . Marital Status: Single    Spouse Name: n/a    Number of Children: 1  . Years of Education: 8   Occupational History  . Investment banker, operational   Social History Main Topics  . Smoking status: Current Some Day Smoker    Types: Cigarettes  . Smokeless tobacco: Never Used  . Alcohol Use: 2.4 oz/week    2 Glasses of wine, 2 Cans of beer per week  . Drug Use: No  . Sexually Active: Not on file   Other Topics Concern  . Not on file   Social History Narrative   Marital status: single; dating. Moved from [place] to [place] in 2001 with his father.  Family (1 brother and 1 sister) in [place]. Mother died there 01-20-2013.      Children: one son in [place].      Employment: employed      Tobacco: 1-2 cigarettes a week.      Alcohol: alcohol on weekends.         History reviewed. No pertinent family history.   Review of Systems As above.    Objective:   Physical Exam  Blood pressure 152/84, pulse 112, temperature 98.4 F (36.9 C), temperature source Oral, resp. rate 17, height 5'  5.5" (1.664 m), weight 255 lb (115.667 kg), SpO2 96.00%. Body mass index is 41.77 kg/(m^2). Well-developed, well nourished African man who is awake, alert and oriented, in NAD. HEENT: [place]/AT, PERRL.  Sclera and conjunctiva are clear.  EAC are patent, TMs are normal in appearance. Nasal mucosa is pink and moist, white purulent drainage between the nasal turbinates.. OP is clear. Neck: supple, non-tender, no lymphadenopathy, thyromegaly. Heart: RRR, no murmur Lungs: normal effort, CTA Extremities: no cyanosis, clubbing or edema. Skin: warm and dry without rash. Psychologic: good mood and appropriate affect, normal speech and behavior.     Assessment & Plan:  Sinusitis - Plan: ipratropium (ATROVENT) 0.03 % nasal spray, Guaifenesin (MUCINEX MAXIMUM STRENGTH) 1200 MG TB12, amoxicillin (AMOXIL) 875 MG tablet  Cough - Plan: benzonatate (TESSALON) 100 MG capsule

## 2013-08-15 ENCOUNTER — Ambulatory Visit (INDEPENDENT_AMBULATORY_CARE_PROVIDER_SITE_OTHER): Payer: PRIVATE HEALTH INSURANCE | Admitting: Emergency Medicine

## 2013-08-15 VITALS — BP 120/74 | HR 97 | Temp 98.5°F | Resp 18 | Ht 66.0 in | Wt 248.4 lb

## 2013-08-15 DIAGNOSIS — M109 Gout, unspecified: Secondary | ICD-10-CM

## 2013-08-15 MED ORDER — ALLOPURINOL 100 MG PO TABS: ORAL_TABLET | ORAL | Status: DC

## 2013-08-15 MED ORDER — COLCHICINE 0.6 MG PO TABS: ORAL_TABLET | ORAL | Status: DC

## 2013-08-15 MED ORDER — INDOMETHACIN 50 MG PO CAPS: 50.0000 mg | ORAL_CAPSULE | Freq: Three times a day (TID) | ORAL | Status: DC

## 2013-08-15 NOTE — Progress Notes (Signed)
[facility] [address] [place] [address] [phone]  Date:  08/15/2013   Name:  Richard Berger   DOB:  Apr 02, 1976   MRN:  [id number]  PCP:  Tally Due, MD    Chief Complaint: Gout   History of Present Illness:  Richard Berger is a 37 y.o. very pleasant male patient who presents with the following:  Four day history gout flare in right ankle.  No history of injury or overuse.  No diet change.  No antecedent illness.  Has experienced frequent flares in past and is not on suppressive medication.  No improvement with over the counter medications or other home remedies. Denies other complaint or health concern today.   Patient Active Problem List   Diagnosis Date Noted  . Gout 06/09/2012    Past Medical History  Diagnosis Date  . Gout     Past Surgical History  Procedure Laterality Date  . Eye surgery Right     Cataract    History  Substance Use Topics  . Smoking status: Current Some Day Smoker    Types: Cigarettes  . Smokeless tobacco: Never Used  . Alcohol Use: 2.4 oz/week    2 Glasses of wine, 2 Cans of beer per week    History reviewed. No pertinent family history.  No Known Allergies  Medication list has been reviewed and updated.  Current Outpatient Prescriptions on File Prior to Visit  Medication Sig Dispense Refill  . amoxicillin (AMOXIL) 875 MG tablet Take 2 tablets (1,750 mg total) by mouth 2 (two) times daily.  20 tablet  0  . benzonatate (TESSALON) 100 MG capsule Take 1-2 capsules (100-200 mg total) by mouth 3 (three) times daily as needed for cough.  40 capsule  0  . colchicine 0.6 MG tablet One po tid for 5 days then one daily tell pain is improved  30 tablet  3  . Guaifenesin (MUCINEX MAXIMUM STRENGTH) 1200 MG TB12 Take 1 tablet (1,200 mg total) by mouth every 12 (twelve) hours as needed.  14 tablet  1  . ipratropium (ATROVENT) 0.03 % nasal spray Place 2 sprays into the nose 2 (two) times daily.  30 mL  0   No  current facility-administered medications on file prior to visit.    Review of Systems:  As per HPI, otherwise negative.    Physical Examination: Filed Vitals:   08/15/13 1010  BP: 120/74  Pulse: 97  Temp: 98.5 F (36.9 C)  Resp: 18   Filed Vitals:   08/15/13 1010  Height: 5\' 6"  (1.676 m)  Weight: 248 lb 6.4 oz (112.674 kg)   Body mass index is 40.11 kg/(m^2). Ideal Body Weight: Weight in (lb) to have BMI = 25: 154.6   GEN: WDWN, NAD, Non-toxic, Alert & Oriented x 3 HEENT: Atraumatic, Normocephalic.  Ears and Nose: No external deformity. EXTR: No clubbing/cyanosis/edema NEURO: antalgic gait.  PSYCH: Normally interactive. Conversant. Not depressed or anxious appearing.  Calm demeanor.  RIGHT ankle:   Erythematous, warm and tender.  guards  Assessment and Plan: Gout Indocin Colchicine Allopurinol to start in two weeks   Signed,  Phillips Odor, MD

## 2013-08-15 NOTE — Patient Instructions (Signed)
Gout  Gout is an inflammatory condition (arthritis) caused by a buildup of uric acid crystals in the joints. Uric acid is a chemical that is normally present in the blood. Under some circumstances, uric acid can form into crystals in your joints. This causes joint redness, soreness, and swelling (inflammation). Repeat attacks are common. Over time, uric acid crystals can form into masses (tophi) near a joint, causing disfigurement. Gout is treatable and often preventable.  CAUSES   The disease begins with elevated levels of uric acid in the blood. Uric acid is produced by your body when it breaks down a naturally found substance called purines. This also happens when you eat certain foods such as meats and fish. Causes of an elevated uric acid level include:   Being passed down from parent to child (heredity).   Diseases that cause increased uric acid production (obesity, psoriasis, some cancers).   Excessive alcohol use.   Diet, especially diets rich in meat and seafood.   Medicines, including certain cancer-fighting drugs (chemotherapy), diuretics, and aspirin.   Chronic kidney disease. The kidneys are no longer able to remove uric acid well.   Problems with metabolism.  Conditions strongly associated with gout include:   Obesity.   High blood pressure.   High cholesterol.   Diabetes.  Not everyone with elevated uric acid levels gets gout. It is not understood why some people get gout and others do not. Surgery, joint injury, and eating too much of certain foods are some of the factors that can lead to gout.  SYMPTOMS    An attack of gout comes on quickly. It causes intense pain with redness, swelling, and warmth in a joint.   Fever can occur.   Often, only one joint is involved. Certain joints are more commonly involved:   Base of the big toe.   Knee.   Ankle.   Wrist.   Finger.  Without treatment, an attack usually goes away in a few days to weeks. Between attacks, you usually will not have  symptoms, which is different from many other forms of arthritis.  DIAGNOSIS   Your caregiver will suspect gout based on your symptoms and exam. Removal of fluid from the joint (arthrocentesis) is done to check for uric acid crystals. Your caregiver will give you a medicine that numbs the area (local anesthetic) and use a needle to remove joint fluid for exam. Gout is confirmed when uric acid crystals are seen in joint fluid, using a special microscope. Sometimes, blood, urine, and X-ray tests are also used.  TREATMENT   There are 2 phases to gout treatment: treating the sudden onset (acute) attack and preventing attacks (prophylaxis).  Treatment of an Acute Attack   Medicines are used. These include anti-inflammatory medicines or steroid medicines.   An injection of steroid medicine into the affected joint is sometimes necessary.   The painful joint is rested. Movement can worsen the arthritis.   You may use warm or cold treatments on painful joints, depending which works best for you.   Discuss the use of coffee, vitamin C, or cherries with your caregiver. These may be helpful treatment options.  Treatment to Prevent Attacks  After the acute attack subsides, your caregiver may advise prophylactic medicine. These medicines either help your kidneys eliminate uric acid from your body or decrease your uric acid production. You may need to stay on these medicines for a very long time.  The early phase of treatment with prophylactic medicine can be associated   with an increase in acute gout attacks. For this reason, during the first few months of treatment, your caregiver may also advise you to take medicines usually used for acute gout treatment. Be sure you understand your caregiver's directions.  You should also discuss dietary treatment with your caregiver. Certain foods such as meats and fish can increase uric acid levels. Other foods such as dairy can decrease levels. Your caregiver can give you a list of foods  to avoid.  HOME CARE INSTRUCTIONS    Do not take aspirin to relieve pain. This raises uric acid levels.   Only take over-the-counter or prescription medicines for pain, discomfort, or fever as directed by your caregiver.   Rest the joint as much as possible. When in bed, keep sheets and blankets off painful areas.   Keep the affected joint raised (elevated).   Use crutches if the painful joint is in your leg.   Drink enough water and fluids to keep your urine clear or pale yellow. This helps your body get rid of uric acid. Do not drink alcoholic beverages. They slow the passage of uric acid.   Follow your caregiver's dietary instructions. Pay careful attention to the amount of protein you eat. Your daily diet should emphasize fruits, vegetables, whole grains, and fat-free or low-fat milk products.   Maintain a healthy body weight.  SEEK MEDICAL CARE IF:    You have an oral temperature above 102 F (38.9 C).   You develop diarrhea, vomiting, or any side effects from medicines.   You do not feel better in 24 hours, or you are getting worse.  SEEK IMMEDIATE MEDICAL CARE IF:    Your joint becomes suddenly more tender and you have:   Chills.   An oral temperature above 102 F (38.9 C), not controlled by medicine.  MAKE SURE YOU:    Understand these instructions.   Will watch your condition.   Will get help right away if you are not doing well or get worse.  Document Released: 10/22/2000 Document Revised: 01/17/2012 Document Reviewed: 02/02/2010  Exit[employer] Patient Information 2014 [employer].

## 2013-11-12 ENCOUNTER — Ambulatory Visit (INDEPENDENT_AMBULATORY_CARE_PROVIDER_SITE_OTHER): Payer: PRIVATE HEALTH INSURANCE | Admitting: Internal Medicine

## 2013-11-12 VITALS — BP 112/80 | HR 105 | Temp 99.9°F | Resp 16 | Ht 65.75 in | Wt 254.0 lb

## 2013-11-12 DIAGNOSIS — R05 Cough: Secondary | ICD-10-CM

## 2013-11-12 DIAGNOSIS — J111 Influenza due to unidentified influenza virus with other respiratory manifestations: Secondary | ICD-10-CM

## 2013-11-12 DIAGNOSIS — R5081 Fever presenting with conditions classified elsewhere: Secondary | ICD-10-CM

## 2013-11-12 DIAGNOSIS — R509 Fever, unspecified: Secondary | ICD-10-CM

## 2013-11-12 DIAGNOSIS — J101 Influenza due to other identified influenza virus with other respiratory manifestations: Secondary | ICD-10-CM

## 2013-11-12 DIAGNOSIS — R059 Cough, unspecified: Secondary | ICD-10-CM

## 2013-11-12 LAB — POCT INFLUENZA A/B
Influenza A, POC: POSITIVE
Influenza B, POC: NEGATIVE

## 2013-11-12 MED ORDER — OSELTAMIVIR PHOSPHATE 75 MG PO CAPS: 75.0000 mg | ORAL_CAPSULE | Freq: Two times a day (BID) | ORAL | Status: DC

## 2013-11-12 MED ORDER — HYDROCOD POLST-CHLORPHEN POLST 10-8 MG/5ML PO LQCR: 5.0000 mL | Freq: Two times a day (BID) | ORAL | Status: DC | PRN

## 2013-11-12 NOTE — Patient Instructions (Signed)

## 2013-11-12 NOTE — Progress Notes (Signed)
   Subjective:    Patient ID: Richard Berger, male    DOB: 08-12-1976, 38 y.o.   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 Started a cough 2 days ago. Pt states that he has coughed so hard he now has a pain in his neck and back. He states he has chills and a fever that started about the same time.   He does not know of anyone that he has contact with that is ill.   He has tried using NyQuil and it has not provided any relief.   He did not have his flu shot this year. He was offered it at work and was sick on the day it was given. He will need   Review of Systems Child asthma    Objective:   Physical Exam  Vitals reviewed. Constitutional: He is oriented to person, place, and time. He appears well-developed and well-nourished. No distress.  HENT:  Head: Normocephalic.  Right Ear: External ear normal.  Left Ear: External ear normal.  Nose: Mucosal edema, rhinorrhea and sinus tenderness present. Right sinus exhibits no frontal sinus tenderness. Left sinus exhibits no frontal sinus tenderness.  Mouth/Throat: Oropharynx is clear and moist.  Eyes: EOM are normal. No scleral icterus.  Neck: Normal range of motion. Neck supple.  Cardiovascular: Normal rate, regular rhythm and normal heart sounds.   Pulmonary/Chest: Effort normal and breath sounds normal. He has no wheezes. He has no rales. He exhibits no tenderness.  Musculoskeletal: Normal range of motion.  Lymphadenopathy:    He has no cervical adenopathy.  Neurological: He is alert and oriented to person, place, and time. He has normal reflexes. He exhibits normal muscle tone. Coordination normal.  Skin: No rash noted.  Psychiatric: He has a normal mood and affect.   Flu test Results for orders placed in visit on 11/12/13  POCT INFLUENZA A/B      Result Value Range   Influenza A, POC Positive     Influenza B, POC Negative            Assessment & Plan:    Influenza A Tamiflu 7d/Tussionex for cough

## 2013-11-12 NOTE — Progress Notes (Signed)
   Subjective:    Patient ID: Richard Berger, male    DOB: 23-Oct-1976, 38 y.o.   MRN: [id number]  HPI    Review of Systems     Objective:   Physical Exam        Assessment & Plan:

## 2013-11-22 ENCOUNTER — Ambulatory Visit (INDEPENDENT_AMBULATORY_CARE_PROVIDER_SITE_OTHER): Payer: PRIVATE HEALTH INSURANCE | Admitting: Physician Assistant

## 2013-11-22 ENCOUNTER — Ambulatory Visit: Payer: PRIVATE HEALTH INSURANCE

## 2013-11-22 VITALS — BP 116/72 | HR 93 | Temp 98.8°F | Resp 18 | Ht 65.5 in | Wt 246.0 lb

## 2013-11-22 DIAGNOSIS — R05 Cough: Secondary | ICD-10-CM

## 2013-11-22 DIAGNOSIS — R059 Cough, unspecified: Secondary | ICD-10-CM

## 2013-11-22 DIAGNOSIS — J111 Influenza due to unidentified influenza virus with other respiratory manifestations: Secondary | ICD-10-CM

## 2013-11-22 DIAGNOSIS — J9801 Acute bronchospasm: Secondary | ICD-10-CM

## 2013-11-22 MED ORDER — BENZONATATE 200 MG PO CAPS: 200.0000 mg | ORAL_CAPSULE | Freq: Three times a day (TID) | ORAL | Status: DC | PRN

## 2013-11-22 MED ORDER — HYDROCODONE-HOMATROPINE 5-1.5 MG/5ML PO SYRP: ORAL_SOLUTION | ORAL | Status: DC

## 2013-11-22 MED ORDER — PREDNISONE 20 MG PO TABS: ORAL_TABLET | ORAL | Status: DC

## 2013-11-22 MED ORDER — AZITHROMYCIN 250 MG PO TABS: ORAL_TABLET | ORAL | Status: DC

## 2013-11-22 NOTE — Progress Notes (Signed)
Subjective:    Patient ID: Richard Berger, male    DOB: 05/28/1976, 38 y.o.   MRN: [id number]  HPI Primary Physician: Tally DueGUEST, CHRIS WARREN, MD  Chief Complaint: Follow up influenza A  HPI: [age] y.o. male with history below presents for follow up influenza A. Patient initially evaluated on 11/12/13 with a 2 day history of nasal congestion, headache, cough, fever, and chills. At that time he was found to have influenza A on a nasopharyngeal swab. He was started on Tamiflu and Tussionex.   He comes in today with persistent cough. Cough is occasionally productive of clear sputum and not associated with time of day. No SOB or wheezing. He is sore from all of his coughing. His coughing is causing headaches at this point. His congestion, fever, chills, and myalgias have resolved. He was unaware that the flu took time to resolve. He thought the medication would resolve it right away. Normal appetite. No weight loss or night sweats. No known sick contacts.   He denies any recent travels.   Past Medical History  Diagnosis Date  . Gout      Home Meds: Prior to Admission medications   Medication Sig Start Date End Date Taking? Authorizing Provider  allopurinol (ZYLOPRIM) 100 MG tablet One daily for three days, then two for three days and finally 3 daily.  Do not start before 10/25 08/15/13  No Phillips OdorJeffery Anderson, MD  amoxicillin (AMOXIL) 875 MG tablet Take 2 tablets (1,750 mg total) by mouth 2 (two) times daily. 01/22/13  No Chelle S Jeffery, PA-C  Aspirin-Acetaminophen (GOODYS BODY PAIN PO) Take by mouth.   No Historical Provider, MD  benzonatate (TESSALON) 100 MG capsule Take 1-2 capsules (100-200 mg total) by mouth 3 (three) times daily as needed for cough. 01/22/13  No Chelle S Jeffery, PA-C  chlorpheniramine-HYDROcodone (TUSSIONEX PENNKINETIC ER) 10-8 MG/5ML LQCR Take 5 mLs by mouth every 12 (twelve) hours as needed for cough. 11/12/13  No Jonita Albeehris W Guest, MD  colchicine 0.6 MG tablet One po tid for 5 days  then one daily tell pain is improved 10/02/12  No Peyton Najjaravid H Hopper, MD  colchicine 0.6 MG tablet Two now and one in one hour, tomorrow 1 po BID. 08/15/13  No Phillips OdorJeffery Anderson, MD  Guaifenesin [facility](MUCINEX MAXIMUM STRENGTH) 1200 MG TB12 Take 1 tablet (1,200 mg total) by mouth every 12 (twelve) hours as needed. 01/22/13  No Chelle S Jeffery, PA-C  indomethacin (INDOCIN) 50 MG capsule Take 1 capsule (50 mg total) by mouth 3 (three) times daily with meals. 08/15/13  No Phillips OdorJeffery Anderson, MD  ipratropium (ATROVENT) 0.03 % nasal spray Place 2 sprays into the nose 2 (two) times daily. 01/22/13  No Chelle S Jeffery, PA-C  oseltamivir (TAMIFLU) 75 MG capsule Take 1 capsule (75 mg total) by mouth 2 (two) times daily. 11/12/13  No Jonita Albeehris W Guest, MD    Allergies: No Known Allergies  History   Social History  . Marital Status: Single    Spouse Name: n/a    Number of Children: 1  . Years of Education: 8   Occupational History  . 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 Smoking status: Current Some Day Smoker    Types: Cigarettes  . Smokeless tobacco: Never Used  . Alcohol Use: 2.4 oz/week    2 Glasses of wine, 2 Cans of beer per week  . Drug Use: No  . Sexual Activity: Not on file   Other Topics  Concern  . Not on file   Social History Narrative   Marital status: single; dating. Moved from [place] to [place] in 2001 with his father.  Family (1 brother and 1 sister) in [place]. Mother died there February 14, 2013.      Children: one son in [place].      Employment: employed      Tobacco: 1-2 cigarettes a week.      Alcohol: alcohol on weekends.            Review of Systems  Constitutional: Negative for fever, chills and fatigue.       No sick contacts.   HENT: Positive for hearing loss, sinus pressure and sneezing. Negative for congestion, ear pain, postnasal drip, rhinorrhea and sore throat.        Lost his voice at the onset of the illness.  Respiratory: Positive for  cough. Negative for shortness of breath and wheezing.   Cardiovascular: Negative for leg swelling.  Gastrointestinal: Negative for nausea, vomiting and diarrhea.  Musculoskeletal: Negative for myalgias.  Skin: Negative for rash.  Neurological: Positive for headaches.       Headache secondary to coughing.        Objective:   Physical Exam  Physical Exam: Blood pressure 116/72, pulse 93, temperature 98.8 F (37.1 C), temperature source Oral, resp. rate 18, height 5' 5.5" (1.664 m), weight 246 lb (111.585 kg), SpO2 97.00%., Body mass index is 40.3 kg/(m^2). General: Well developed, well nourished, in no acute distress. Head: Normocephalic, atraumatic, eyes without discharge, sclera non-icteric, nares are congested. Bilateral auditory canals clear, TM's are without perforation, pearly grey with reflective cone of light bilaterally. No sinus TTP. Oral cavity moist, dentition normal. Posterior pharynx with post nasal drip and mild erythema. No peritonsillar abscess or tonsillar exudate. Uvula midline.  Neck: Supple. No thyromegaly. Full ROM. No lymphadenopathy. Lungs: Clear to auscultation bilaterally without wheezes, rales, or rhonchi. Breathing is unlabored.  Heart: RRR with S1 S2. No murmurs, rubs, or gallops appreciated. Msk:  Strength and tone normal for age. Extremities: No clubbing or cyanosis. No edema. Neuro: Alert and oriented X 3. Moves all extremities spontaneously. CNII-XII grossly in tact. Psych:  Responds to questions appropriately with a normal affect.    CXR:  [facility] reading (PRIMARY) by  Dr. Cleta Alberts. Borderline cardiomegaly. Increased interstitial markings without discrete infiltrate.         Assessment & Plan:  38 year old male with bronchospasm, and cough secondary to recent influenza A infection  -Azithromycin 250 MG #6 2 po first day then 1 po next 4 days no RF -Tessalon Perles 200 mg 1 po tid prn cough #30 no RF  -Prednisone 20 mg #12 3x2, 2x2, 1x2 no  RF -Rest/fluids -RTC precautions   Eula Listen, MHS, PA-C [facility] [address] [place], [place] [address] [phone] [facility] 11/22/2013 9:51 AM

## 2013-12-09 ENCOUNTER — Ambulatory Visit (INDEPENDENT_AMBULATORY_CARE_PROVIDER_SITE_OTHER): Payer: PRIVATE HEALTH INSURANCE | Admitting: Family Medicine

## 2013-12-09 VITALS — BP 108/74 | HR 87 | Temp 98.3°F | Resp 16 | Ht 65.5 in | Wt 243.0 lb

## 2013-12-09 DIAGNOSIS — M109 Gout, unspecified: Secondary | ICD-10-CM

## 2013-12-09 DIAGNOSIS — J9801 Acute bronchospasm: Secondary | ICD-10-CM

## 2013-12-09 DIAGNOSIS — R059 Cough, unspecified: Secondary | ICD-10-CM

## 2013-12-09 DIAGNOSIS — R05 Cough: Secondary | ICD-10-CM

## 2013-12-09 DIAGNOSIS — M545 Low back pain, unspecified: Secondary | ICD-10-CM

## 2013-12-09 MED ORDER — PREDNISONE 20 MG PO TABS: ORAL_TABLET | ORAL | Status: DC

## 2013-12-09 NOTE — Patient Instructions (Signed)

## 2013-12-09 NOTE — Progress Notes (Signed)
° °  Subjective:    Patient ID: Richard Berger, male    DOB: June 25, 1976, 38 y.o.   MRN: [id number]  HPI  This chart was scribed for Kenyon AnaKurt Lauenstein-MD by Smiley HousemanFallon Davis, Scribe. This patient was seen in room 5 and the patient's care was started at 9:13 AM.  HPI Comments: Richard Berger is a 38 y.o. male who presents to the [facility] complaining of worsening gout on his right foot for about 2 days.  Pt states that the colchicine he was prescribed by Dr. Dareen PianoAnderson didn't provide relief.  Pt also complains of moderate back pain that started about 7 days ago.  Pt denies numbness and tingling in his legs.  Pt denies h/o of diabetes.  Pt is from [place] and works in a [employer]house.  Pt states that he doesn't go back to [place] very often.    Past Surgical History  Procedure Laterality Date   Eye surgery Right     Cataract    History reviewed. No pertinent family history.  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 Moved from [place] in 2001 with his father.  Family (1 brother and 1 sister) in [place]. Mother died there 01/18/2013.      Children: one son in [place].      Employment: employed      Tobacco: 1-2 cigarettes a week.      Alcohol: alcohol on weekends.         No Known Allergies  Patient Active Problem List   Diagnosis Date Noted   Gout 06/09/2012    Review of Systems  Constitutional: Negative for fever and chills.  HENT: Negative for congestion and  rhinorrhea.   Respiratory: Negative for cough and shortness of breath.   Cardiovascular: Negative for chest pain.  Gastrointestinal: Negative for nausea, vomiting, abdominal pain and diarrhea.  Musculoskeletal: Positive for back pain.  Skin: Negative for color change and rash.  Neurological: Negative for syncope and headaches.       Objective:   Physical Exam Morbid obese Triage Vitals: BP 108/74   Pulse 87   Temp(Src) 98.3 F (36.8 C) (Oral)   Resp 16   Ht 5' 5.5" (1.664 m)   Wt 243 lb (110.224 kg)   BMI 39.81 kg/m2   SpO2 98% HEENT: Unremarkable Chest: Clear Straight-leg raising negative Right ankle: Full range of motion mildly swollen, tender anteriorly Skin: Clear    Assessment & Plan:    Cough  Acute bronchospasm  Gout of ankle - Plan: predniSONE (DELTASONE) 20 MG tablet  Signed, [name], MD

## 2014-01-13 IMAGING — CR DG CHEST 1V
1 series · 1 of 1 positions shown · non-contrast
Comparison: 02/02/2008

CLINICAL DATA: Constipation

CHEST - 1 VIEW

[PA]
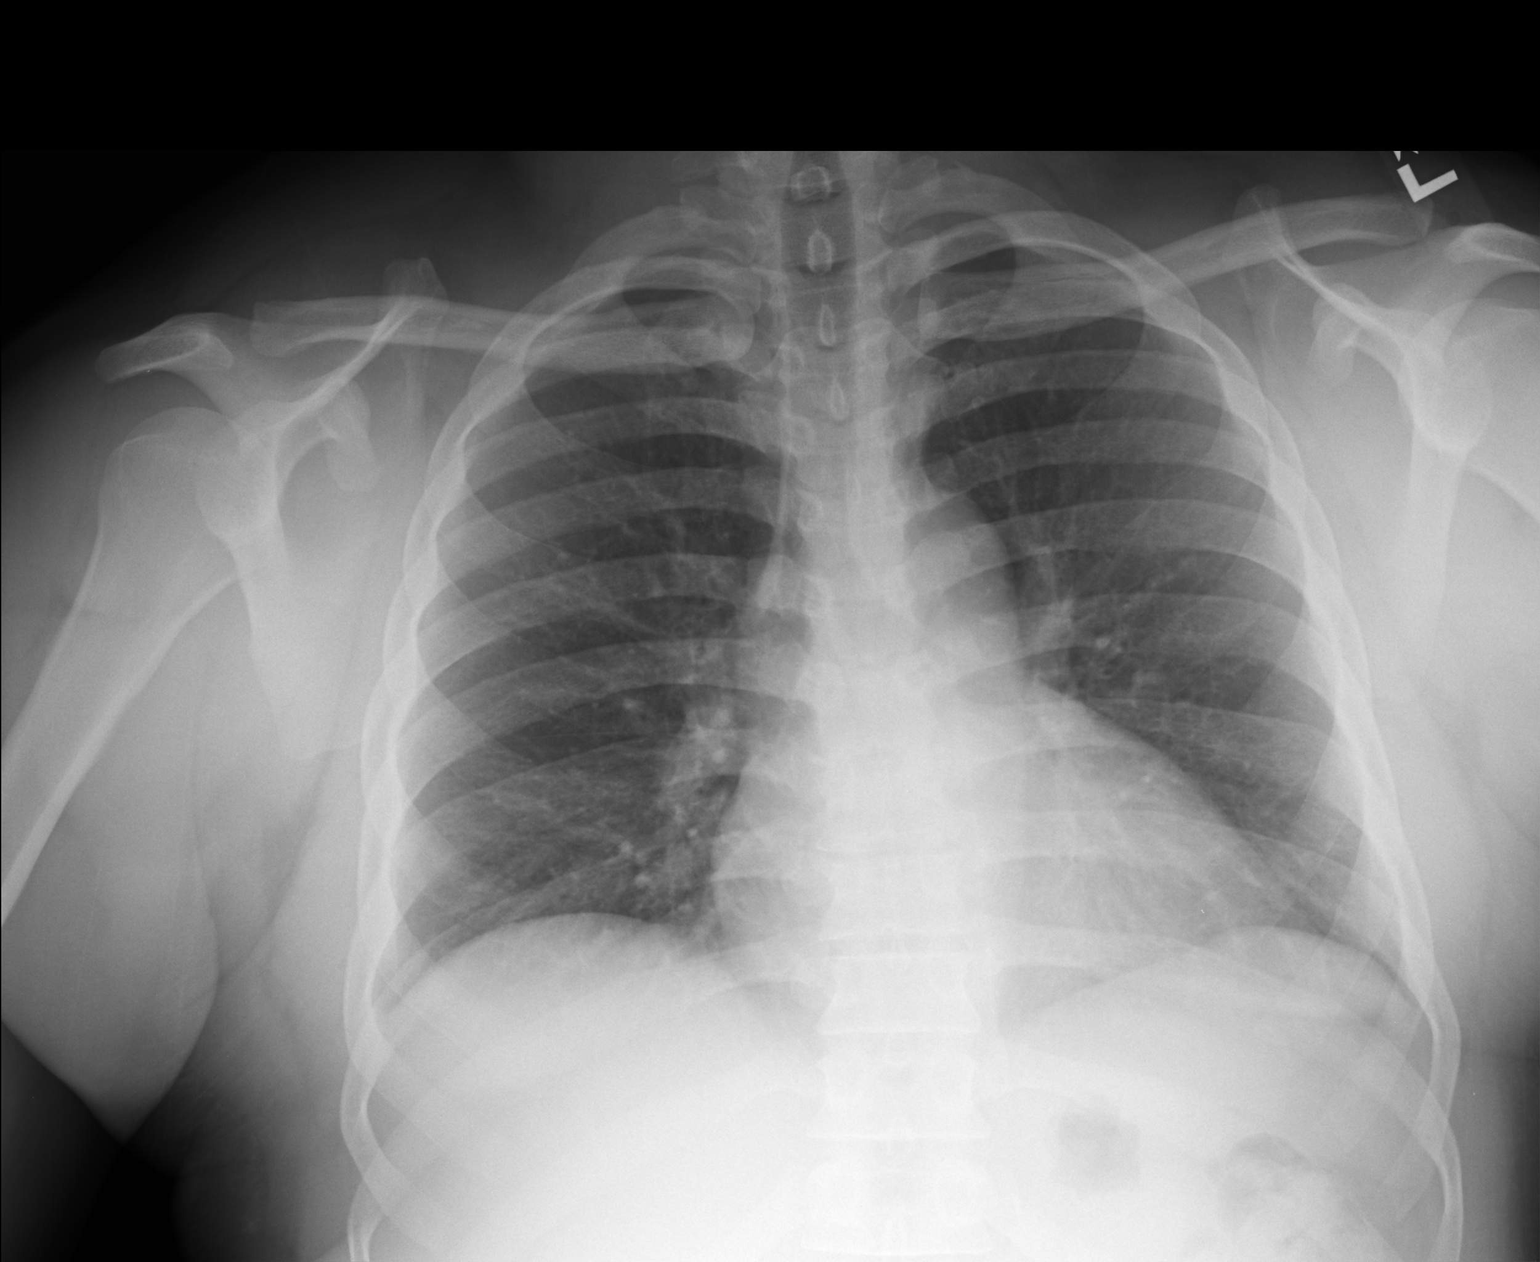

[1 of 1 positions shown; findings below may reference images not displayed]

FINDINGS: Cardiomediastinal silhouette is stable.  No acute
infiltrate or pleural effusion.  No pulmonary edema.  Bony thorax
is unremarkable.
IMPRESSION: No active disease.  No significant change.

## 2014-01-13 IMAGING — CR DG ABDOMEN 1V
1 series · 1 of 1 positions shown · non-contrast
Comparison: Supine view same date

CLINICAL DATA: Constipation

ABDOMEN - 1 VIEW

[AP]
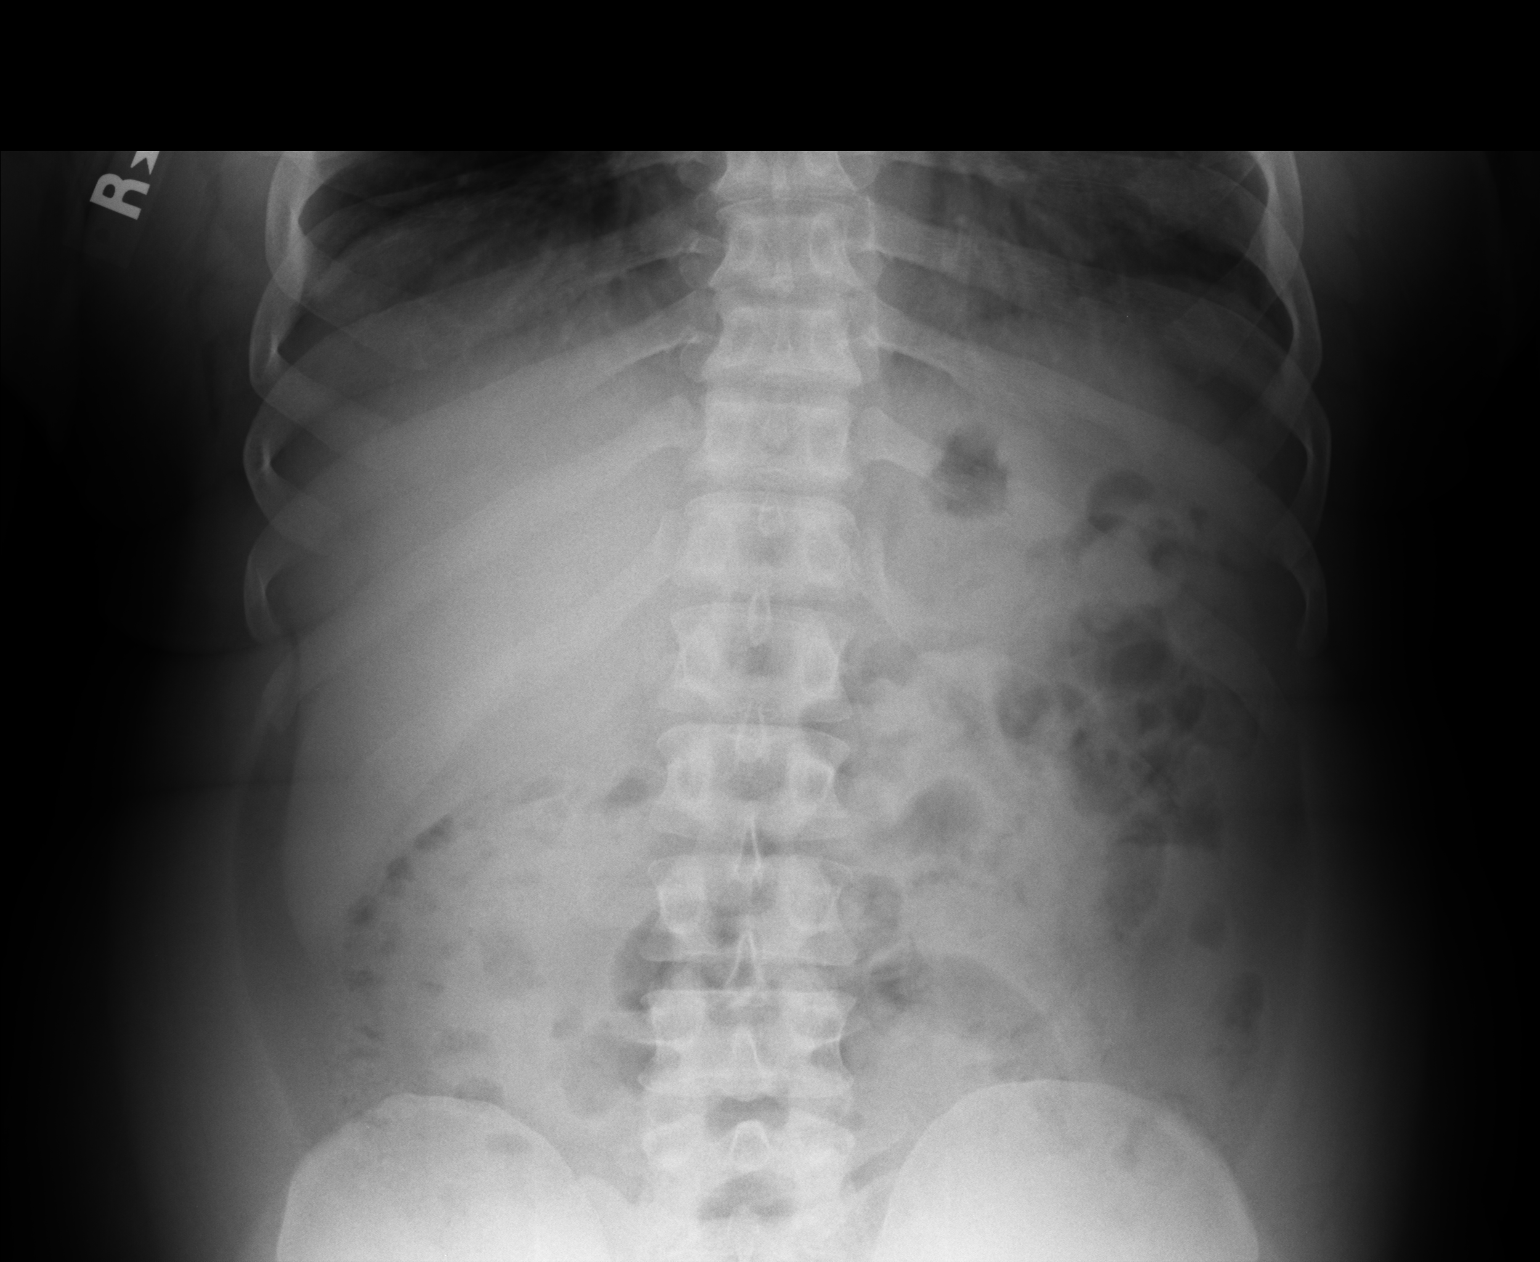

[1 of 1 positions shown; findings below may reference images not displayed]

FINDINGS: Single upright view of the abdomen submitted.  There is
nonspecific nonobstructive bowel gas pattern.  No free abdominal
air.  Stool noted in the right colon.
IMPRESSION: Nonspecific nonobstructive bowel gas pattern.  No free abdominal
air.

## 2014-02-10 ENCOUNTER — Other Ambulatory Visit: Payer: Self-pay | Admitting: Family Medicine

## 2014-04-17 ENCOUNTER — Ambulatory Visit (INDEPENDENT_AMBULATORY_CARE_PROVIDER_SITE_OTHER): Payer: PRIVATE HEALTH INSURANCE | Admitting: Family Medicine

## 2014-04-17 VITALS — BP 128/80 | HR 98 | Temp 97.8°F | Ht 65.5 in | Wt 254.0 lb

## 2014-04-17 DIAGNOSIS — K219 Gastro-esophageal reflux disease without esophagitis: Secondary | ICD-10-CM

## 2014-04-17 DIAGNOSIS — R1310 Dysphagia, unspecified: Secondary | ICD-10-CM

## 2014-04-17 MED ORDER — ESOMEPRAZOLE MAGNESIUM 40 MG PO CPDR: 40.0000 mg | DELAYED_RELEASE_CAPSULE | Freq: Every day | ORAL | Status: DC

## 2014-04-17 NOTE — Progress Notes (Signed)
This chart was scribed for Richard SidleKurt Lauenstein, MD by Luisa DagoPriscilla Tutu, ED Scribe. This patient was seen in room 8 and the patient's care was started at 6:19 PM.  Patient ID: Richard AlaJessy A Berger MRN: [id number], DOB: [birth year], 38 y.o. Date of Encounter: 04/17/2014, 6:18 PM  Primary Physician: Tally DueGUEST, CHRIS WARREN, MD  Chief Complaint:  Chief Complaint  Patient presents with   Gastrophageal Reflux     HPI: 38 y.o. 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 Pt states that for the past 3 weeks he's been experiencing some discomfort in his chest. He states that following a meal he feels his food coming back up. Which causes him to have mild episodes of emesis. He denies any associated chest pain. Pt states that he's been taking some OTC medication to relieve his symptoms with no improvement. He denies any prior episodes. Pt denies any other pertinent medical history.    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 Moved from [place] in 2001 with [place] father.  Family (1 brother and 1 sister) in [place]. Mother died there 01/18/2013.      Children: one son in [place].      Employment: employed      Tobacco: 1-2 cigarettes a week.      Alcohol: alcohol on weekends.          Review  of Systems: positive for emesis Constitutional: negative for chills, fever, night sweats, weight changes, or fatigue  HEENT: negative for vision changes, hearing loss, congestion, rhinorrhea, ST, epistaxis, or sinus pressure Cardiovascular: negative for chest pain or palpitations Respiratory: negative for hemoptysis, wheezing, shortness of breath, or cough Abdominal: negative for abdominal pain, nausea, vomiting, diarrhea, or constipation Dermatological: negative for rash Neurologic: negative for headache, dizziness, or syncope All other systems reviewed and are otherwise negative with the exception to those above and in the HPI.   Physical Exam: receeding gingiva noted. Exotropia of the right eye. Normal chest sounds. No wheezes or rales.  Blood pressure 128/80, pulse 98, temperature 97.8 F (36.6 C), temperature source Oral, height 5' 5.5" (1.664 m), weight 254 lb (115.214 kg), SpO2 98.00%., Body mass index is 41.61 kg/(m^2). General: Well developed, well nourished, in no acute distress. Head: Normocephalic, atraumatic, eyes without discharge, sclera non-icteric, nares are without discharge. Bilateral auditory canals clear, TM's are without perforation, pearly grey and translucent with reflective cone of light bilaterally. Oral cavity moist, posterior pharynx without exudate, erythema, peritonsillar abscess, or post nasal drip.  Neck: Supple. No thyromegaly. Full ROM. No lymphadenopathy. Lungs: Clear bilaterally to auscultation without wheezes, rales, or rhonchi.  Breathing is unlabored. Heart: RRR with S1 S2. No murmurs, rubs, or gallops appreciated. Abdomen: Soft, non-tender, non-distended with normoactive bowel sounds. No hepatomegaly. No rebound/guarding. No obvious abdominal masses. Msk:  Strength and tone normal for age. Extremities/Skin: Warm and dry. No clubbing or cyanosis. No edema. No rashes or suspicious lesions. Neuro: Alert and oriented X 3. Moves all extremities spontaneously.  Gait is normal. CNII-XII grossly in tact. Psych:  Responds to questions appropriately with a normal affect.     ASSESSMENT AND PLAN:  38 y.o. year old male with GERD (gastroesophageal reflux disease) - Plan: esomeprazole (NEXIUM) 40 MG capsule  If symptoms persist over week, patient is to call me for referral to gastroenterology. I personally performed the services described in this documentation, which was scribed in my presence. The recorded information has been reviewed and is accurate.  Signed, Richard Sidle, MD 04/17/2014 6:18 PM

## 2014-04-17 NOTE — Patient Instructions (Signed)
Gastroesophageal Reflux Disease, Adult  Gastroesophageal reflux disease (GERD) happens when acid from your stomach flows up into the esophagus. When acid comes in contact with the esophagus, the acid causes soreness (inflammation) in the esophagus. Over time, GERD may create small holes (ulcers) in the lining of the esophagus.  CAUSES   · Increased body weight. This puts pressure on the stomach, making acid rise from the stomach into the esophagus.  · Smoking. This increases acid production in the stomach.  · Drinking alcohol. This causes decreased pressure in the lower esophageal sphincter (valve or ring of muscle between the esophagus and stomach), allowing acid from the stomach into the esophagus.  · Late evening meals and a full stomach. This increases pressure and acid production in the stomach.  · A malformed lower esophageal sphincter.  Sometimes, no cause is found.  SYMPTOMS   · Burning pain in the lower part of the mid-chest behind the breastbone and in the mid-stomach area. This may occur twice a week or more often.  · Trouble swallowing.  · Sore throat.  · Dry cough.  · Asthma-like symptoms including chest tightness, shortness of breath, or wheezing.  DIAGNOSIS   Your caregiver may be able to diagnose GERD based on your symptoms. In some cases, X-rays and other tests may be done to check for complications or to check the condition of your stomach and esophagus.  TREATMENT   Your caregiver may recommend over-the-counter or prescription medicines to help decrease acid production. Ask your caregiver before starting or adding any new medicines.   HOME CARE INSTRUCTIONS   · Change the factors that you can control. Ask your caregiver for guidance concerning weight loss, quitting smoking, and alcohol consumption.  · Avoid foods and drinks that make your symptoms worse, such as:  · Caffeine or alcoholic drinks.  · Chocolate.  · Peppermint or mint flavorings.  · Garlic and onions.  · Spicy foods.  · Citrus fruits,  such as oranges, lemons, or limes.  · Tomato-based foods such as sauce, chili, salsa, and pizza.  · Fried and fatty foods.  · Avoid lying down for the 3 hours prior to your bedtime or prior to taking a nap.  · Eat small, frequent meals instead of large meals.  · Wear loose-fitting clothing. Do not wear anything tight around your waist that causes pressure on your stomach.  · Raise the head of your bed 6 to 8 inches with wood blocks to help you sleep. Extra pillows will not help.  · Only take over-the-counter or prescription medicines for pain, discomfort, or fever as directed by your caregiver.  · Do not take aspirin, ibuprofen, or other nonsteroidal anti-inflammatory drugs (NSAIDs).  SEEK IMMEDIATE MEDICAL CARE IF:   · You have pain in your arms, neck, jaw, teeth, or back.  · Your pain increases or changes in intensity or duration.  · You develop nausea, vomiting, or sweating (diaphoresis).  · You develop shortness of breath, or you faint.  · Your vomit is green, yellow, black, or looks like coffee grounds or blood.  · Your stool is red, bloody, or black.  These symptoms could be signs of other problems, such as heart disease, gastric bleeding, or esophageal bleeding.  MAKE SURE YOU:   · Understand these instructions.  · Will watch your condition.  · Will get help right away if you are not doing well or get worse.  Document Released: 08/04/2005 Document Revised: 01/17/2012 Document Reviewed: 05/14/2011  Exit[employer]® Patient   Information ©2014 [employer].

## 2014-04-28 ENCOUNTER — Ambulatory Visit (INDEPENDENT_AMBULATORY_CARE_PROVIDER_SITE_OTHER): Payer: PRIVATE HEALTH INSURANCE | Admitting: Emergency Medicine

## 2014-04-28 VITALS — BP 130/80 | HR 97 | Temp 98.4°F | Resp 17 | Ht 66.0 in | Wt 256.0 lb

## 2014-04-28 DIAGNOSIS — R0609 Other forms of dyspnea: Secondary | ICD-10-CM

## 2014-04-28 DIAGNOSIS — M109 Gout, unspecified: Secondary | ICD-10-CM

## 2014-04-28 DIAGNOSIS — Z947 Corneal transplant status: Secondary | ICD-10-CM

## 2014-04-28 DIAGNOSIS — R0683 Snoring: Secondary | ICD-10-CM

## 2014-04-28 DIAGNOSIS — Z0000 Encounter for general adult medical examination without abnormal findings: Secondary | ICD-10-CM

## 2014-04-28 DIAGNOSIS — R0989 Other specified symptoms and signs involving the circulatory and respiratory systems: Secondary | ICD-10-CM

## 2014-04-28 DIAGNOSIS — Z23 Encounter for immunization: Secondary | ICD-10-CM

## 2014-04-28 DIAGNOSIS — E669 Obesity, unspecified: Secondary | ICD-10-CM

## 2014-04-28 LAB — COMPLETE METABOLIC PANEL WITH GFR
ALT: 31 U/L (ref 0–53)
AST: 21 U/L (ref 0–37)
Albumin: 4.1 g/dL (ref 3.5–5.2)
Alkaline Phosphatase: 57 U/L (ref 39–117)
BUN: 11 mg/dL (ref 6–23)
CALCIUM: 9.2 mg/dL (ref 8.4–10.5)
CO2: 22 meq/L (ref 19–32)
Chloride: 106 mEq/L (ref 96–112)
Creat: 1.06 mg/dL (ref 0.50–1.35)
GFR, Est Non African American: >89 mL/min
Glucose, Bld: 96 mg/dL (ref 70–99)
Potassium: 4.1 mEq/L (ref 3.5–5.3)
SODIUM: 137 meq/L (ref 135–145)
TOTAL PROTEIN: 6.8 g/dL (ref 6.0–8.3)
Total Bilirubin: 0.5 mg/dL (ref 0.2–1.2)

## 2014-04-28 LAB — POCT CBC
Granulocyte percent: 44.3 %G (ref 37–80)
HCT, POC: 48.5 % (ref 43.5–53.7)
Hemoglobin: 16 g/dL (ref 14.1–18.1)
Lymph, poc: 3.6 — AB (ref 0.6–3.4)
MCH: 30.1 pg (ref 27–31.2)
MCHC: 33 g/dL (ref 31.8–35.4)
MCV: 91.1 fL (ref 80–97)
MID (CBC): 0.8 (ref 0–0.9)
MPV: 7.7 fL (ref 0–99.8)
POC GRANULOCYTE: 3.5 (ref 2–6.9)
POC LYMPH PERCENT: 45.9 %L (ref 10–50)
POC MID %: 9.8 %M (ref 0–12)
Platelet Count, POC: 345 10*3/uL (ref 142–424)
RBC: 5.32 M/uL (ref 4.69–6.13)
RDW, POC: 14.4 %
WBC: 7.9 10*3/uL (ref 4.6–10.2)

## 2014-04-28 LAB — POCT URINALYSIS DIPSTICK
Bilirubin, UA: NEGATIVE
Glucose, UA: NEGATIVE
KETONES UA: NEGATIVE
Leukocytes, UA: NEGATIVE
Nitrite, UA: NEGATIVE
PH UA: 5.5
PROTEIN UA: NEGATIVE
SPEC GRAV UA: 1.02
Urobilinogen, UA: 0.2

## 2014-04-28 LAB — LIPID PANEL
Cholesterol: 183 mg/dL (ref 0–200)
HDL: 42 mg/dL (ref >39)
LDL CALC: 109 mg/dL — AB (ref 0–99)
Total CHOL/HDL Ratio: 4.4 Ratio
Triglycerides: 158 mg/dL — ABNORMAL HIGH (ref <150)
VLDL: 32 mg/dL (ref 0–40)

## 2014-04-28 LAB — GLUCOSE, POCT (MANUAL RESULT ENTRY): POC Glucose: 73 mg/dl (ref 70–99)

## 2014-04-28 LAB — URIC ACID: Uric Acid, Serum: 9 mg/dL — ABNORMAL HIGH (ref 4.0–7.8)

## 2014-04-28 MED ORDER — PREDNISONE 20 MG PO TABS: ORAL_TABLET | ORAL | Status: DC

## 2014-04-28 NOTE — Patient Instructions (Signed)
Gout Gout is an inflammatory arthritis caused by a buildup of uric acid crystals in the joints. Uric acid is a chemical that is normally present in the blood. When the level of uric acid in the blood is too high it can form crystals that deposit in your joints and tissues. This causes joint redness, soreness, and swelling (inflammation). Repeat attacks are common. Over time, uric acid crystals can form into masses (tophi) near a joint, destroying bone and causing disfigurement. Gout is treatable and often preventable. CAUSES  The disease begins with elevated levels of uric acid in the blood. Uric acid is produced by your body when it breaks down a naturally found substance called purines. Certain foods you eat, such as meats and fish, contain high amounts of purines. Causes of an elevated uric acid level include:  Being passed down from parent to child (heredity).  Diseases that cause increased uric acid production (such as obesity, psoriasis, and certain cancers).  Excessive alcohol use.  Diet, especially diets rich in meat and seafood.  Medicines, including certain cancer-fighting medicines (chemotherapy), water pills (diuretics), and aspirin.  Chronic kidney disease. The kidneys are no longer able to remove uric acid well.  Problems with metabolism. Conditions strongly associated with gout include:  Obesity.  High blood pressure.  High cholesterol.  Diabetes. Not everyone with elevated uric acid levels gets gout. It is not understood why some people get gout and others do not. Surgery, joint injury, and eating too much of certain foods are some of the factors that can lead to gout attacks. SYMPTOMS   An attack of gout comes on quickly. It causes intense pain with redness, swelling, and warmth in a joint.  Fever can occur.  Often, only one joint is involved. Certain joints are more commonly involved:  Base of the big toe.  Knee.  Ankle.  Wrist.  Finger. Without  treatment, an attack usually goes away in a few days to weeks. Between attacks, you usually will not have symptoms, which is different from many other forms of arthritis. DIAGNOSIS  Your caregiver will suspect gout based on your symptoms and exam. In some cases, tests may be recommended. The tests may include:  Blood tests.  Urine tests.  X-rays.  Joint fluid exam. This exam requires a needle to remove fluid from the joint (arthrocentesis). Using a microscope, gout is confirmed when uric acid crystals are seen in the joint fluid. TREATMENT  There are two phases to gout treatment: treating the sudden onset (acute) attack and preventing attacks (prophylaxis).  Treatment of an Acute Attack.  Medicines are used. These include anti-inflammatory medicines or steroid medicines.  An injection of steroid medicine into the affected joint is sometimes necessary.  The painful joint is rested. Movement can worsen the arthritis.  You may use warm or cold treatments on painful joints, depending which works best for you.  Treatment to Prevent Attacks.  If you suffer from frequent gout attacks, your caregiver may advise preventive medicine. These medicines are started after the acute attack subsides. These medicines either help your kidneys eliminate uric acid from your body or decrease your uric acid production. You may need to stay on these medicines for a very long time.  The early phase of treatment with preventive medicine can be associated with an increase in acute gout attacks. For this reason, during the first few months of treatment, your caregiver may also advise you to take medicines usually used for acute gout treatment. Be sure you   understand your caregiver's directions. Your caregiver may make several adjustments to your medicine dose before these medicines are effective.  Discuss dietary treatment with your caregiver or dietitian. Alcohol and drinks high in sugar and fructose and foods  such as meat, poultry, and seafood can increase uric acid levels. Your caregiver or dietician can advise you on drinks and foods that should be limited. HOME CARE INSTRUCTIONS   Do not take aspirin to relieve pain. This raises uric acid levels.  Only take over-the-counter or prescription medicines for pain, discomfort, or fever as directed by your caregiver.  Rest the joint as much as possible. When in bed, keep sheets and blankets off painful areas.  Keep the affected joint raised (elevated).  Apply warm or cold treatments to painful joints. Use of warm or cold treatments depends on which works best for you.  Use crutches if the painful joint is in your leg.  Drink enough fluids to keep your urine clear or pale yellow. This helps your body get rid of uric acid. Limit alcohol, sugary drinks, and fructose drinks.  Follow your dietary instructions. Pay careful attention to the amount of protein you eat. Your daily diet should emphasize fruits, vegetables, whole grains, and fat-free or low-fat milk products. Discuss the use of coffee, vitamin C, and cherries with your caregiver or dietician. These may be helpful in lowering uric acid levels.  Maintain a healthy body weight. SEEK MEDICAL CARE IF:   You develop diarrhea, vomiting, or any side effects from medicines.  You do not feel better in 24 hours, or you are getting worse. SEEK IMMEDIATE MEDICAL CARE IF:   Your joint becomes suddenly more tender, and you have chills or a fever. MAKE SURE YOU:   Understand these instructions.  Will watch your condition.  Will get help right away if you are not doing well or get worse. Document Released: 10/22/2000 Document Revised: 02/19/2013 Document Reviewed: 06/07/2012 [facility]Exit[facility] Patient Information 2015 [place], [place]. This information is not intended to replace advice given to you by your health care Richard Berger. Make sure you discuss any questions you have with your health care Richard Berger. Smoking  Cessation Quitting smoking is important to your health and has many advantages. However, it is not always easy to quit since nicotine is a very addictive drug. Often times, people try 3 times or more before being able to quit. This document explains the best ways for you to prepare to quit smoking. Quitting takes hard work and a lot of effort, but you can do it. ADVANTAGES OF QUITTING SMOKING  You will live longer, feel better, and live better.  Your body will feel the impact of quitting smoking almost immediately.  Within 20 minutes, blood pressure decreases. Your pulse returns to its normal level.  After 8 hours, carbon monoxide levels in the blood return to normal. Your oxygen level increases.  After 24 hours, the chance of having a heart attack starts to decrease. Your breath, hair, and body stop smelling like smoke.  After 48 hours, damaged nerve endings begin to recover. Your sense of taste and smell improve.  After 72 hours, the body is virtually free of nicotine. Your bronchial tubes relax and breathing becomes easier.  After 2 to 12 weeks, lungs can hold more air. Exercise becomes easier and circulation improves.  The risk of having a heart attack, stroke, cancer, or lung disease is greatly reduced.  After 1 year, the risk of coronary heart disease is cut in half.  After  5 years, the risk of stroke falls to the same as a nonsmoker.  After 10 years, the risk of lung cancer is cut in half and the risk of other cancers decreases significantly.  After 15 years, the risk of coronary heart disease drops, usually to the level of a nonsmoker.  If you are pregnant, quitting smoking will improve your chances of having a healthy baby.  The people you live with, especially any children, will be healthier.  You will have extra money to spend on things other than cigarettes. QUESTIONS TO THINK ABOUT BEFORE ATTEMPTING TO QUIT You may want to talk about your answers with your  caregiver.  Why do you want to quit?  If you tried to quit in the past, what helped and what did not?  What will be the most difficult situations for you after you quit? How will you plan to handle them?  Who can help you through the tough times? Your family? Friends? A caregiver?  What pleasures do you get from smoking? What ways can you still get pleasure if you quit? Here are some questions to ask your caregiver:  How can you help me to be successful at quitting?  What medicine do you think would be best for me and how should I take it?  What should I do if I need more help?  What is smoking withdrawal like? How can I get information on withdrawal? GET READY  Set a quit date.  Change your environment by getting rid of all cigarettes, ashtrays, matches, and lighters in your home, car, or work. Do not let people smoke in your home.  Review your past attempts to quit. Think about what worked and what did not. GET SUPPORT AND ENCOURAGEMENT You have a better chance of being successful if you have help. You can get support in many ways.  Tell your family, friends, and co-workers that you are going to quit and need their support. Ask them not to smoke around you.  Get individual, group, or telephone counseling and support. Programs are available at [employer] and health centers. Call your local health department for information about programs in your area.  Spiritual beliefs and practices may help some smokers quit.  Download a "quit meter" on your computer to keep track of quit statistics, such as how long you have gone without smoking, cigarettes not smoked, and money saved.  Get a self-help book about quitting smoking and staying off of tobacco. LEARN NEW SKILLS AND BEHAVIORS  Distract yourself from urges to smoke. Talk to someone, go for a walk, or occupy your time with a task.  Change your normal routine. Take a different route to work. Drink tea instead of coffee.  Eat breakfast in a different place.  Reduce your stress. Take a hot bath, exercise, or read a book.  Plan something enjoyable to do every day. Reward yourself for not smoking.  Explore interactive web-based programs that specialize in helping you quit. GET MEDICINE AND USE IT CORRECTLY Medicines can help you stop smoking and decrease the urge to smoke. Combining medicine with the above behavioral methods and support can greatly increase your chances of successfully quitting smoking.  Nicotine replacement therapy helps deliver nicotine to your body without the negative effects and risks of smoking. Nicotine replacement therapy includes nicotine gum, lozenges, inhalers, nasal sprays, and skin patches. Some may be available over-the-counter and others require a prescription.  Antidepressant medicine helps people abstain from smoking, but how this works  is unknown. This medicine is available by prescription.  Nicotinic receptor partial agonist medicine simulates the effect of nicotine in your brain. This medicine is available by prescription. Ask your caregiver for advice about which medicines to use and how to use them based on your health history. Your caregiver will tell you what side effects to look out for if you choose to be on a medicine or therapy. Carefully read the information on the package. Do not use any other product containing nicotine while using a nicotine replacement product.  RELAPSE OR DIFFICULT SITUATIONS Most relapses occur within the first 3 months after quitting. Do not be discouraged if you start smoking again. Remember, most people try several times before finally quitting. You may have symptoms of withdrawal because your body is used to nicotine. You may crave cigarettes, be irritable, feel very hungry, cough often, get headaches, or have difficulty concentrating. The withdrawal symptoms are only temporary. They are strongest when you first quit, but they will go away within  10-14 days. To reduce the chances of relapse, try to:  Avoid drinking alcohol. Drinking lowers your chances of successfully quitting.  Reduce the amount of caffeine you consume. Once you quit smoking, the amount of caffeine in your body increases and can give you symptoms, such as a rapid heartbeat, sweating, and anxiety.  Avoid smokers because they can make you want to smoke.  Do not let weight gain distract you. Many smokers will gain weight when they quit, usually less than 10 pounds. Eat a healthy diet and stay active. You can always lose the weight gained after you quit.  Find ways to improve your mood other than smoking. FOR MORE INFORMATION  www.smokefree.gov  Document Released: 10/19/2001 Document Revised: 04/25/2012 Document Reviewed: 02/03/2012 [facility] CampusExit[facility] Patient Information 2015 [place], [place]. This information is not intended to replace advice given to you by your health care Richard Berger. Make sure you discuss any questions you have with your health care Richard Berger. Obesity Obesity is defined as having too much total body fat and a body mass index (BMI) of 30 or more. BMI is an estimate of body fat and is calculated from your height and weight. Obesity happens when you consume more calories than you can burn by exercising or performing daily physical tasks. Prolonged obesity can cause major illnesses or emergencies, such as:   A stroke.  Heart disease.  Diabetes.  Cancer.  Arthritis.  High blood pressure (hypertension).  High cholesterol.  Sleep apnea.  Erectile dysfunction.  Infertility problems. CAUSES   Regularly eating unhealthy foods.  Physical inactivity.  Certain disorders, such as an underactive thyroid (hypothyroidism), Cushing's syndrome, and polycystic ovarian syndrome.  Certain medicines, such as steroids, some depression medicines, and antipsychotics.  Genetics.  Lack of sleep. DIAGNOSIS  A caregiver can diagnose obesity after calculating your  BMI. Obesity will be diagnosed if your BMI is 30 or higher.  There are other methods of measuring obesity levels. Some other methods include measuring your skin fold thickness, your waist circumference, and comparing your hip circumference to your waist circumference. TREATMENT  A healthy treatment program includes some or all of the following:  Long-term dietary changes.  Exercise and physical activity.  Behavioral and lifestyle changes.  Medicine only under the supervision of your caregiver. Medicines may help, but only if they are used with diet and exercise programs. An unhealthy treatment program includes:  Fasting.  Fad diets.  Supplements and drugs. These choices do not succeed in long-term weight control.  HOME CARE INSTRUCTIONS   Exercise and perform physical activity as directed by your caregiver. To increase physical activity, try the following:  Use stairs instead of elevators.  Park farther away from store entrances.  Garden, bike, or walk instead of watching television or using the computer.  Eat healthy, low-calorie foods and drinks on a regular basis. Eat more fruits and vegetables. Use low-calorie cookbooks or take healthy cooking classes.  Limit fast food, sweets, and processed snack foods.  Eat smaller portions.  Keep a daily journal of everything you eat. There are many free websites to help you with this. It may be helpful to measure your foods so you can determine if you are eating the correct portion sizes.  Avoid drinking alcohol. Drink more water and drinks without calories.  Take vitamins and supplements only as recommended by your caregiver.  Weight-loss support groups, Optometrist, counselors, and stress reduction education can also be very helpful. SEEK IMMEDIATE MEDICAL CARE IF:  You have chest pain or tightness.  You have trouble breathing or feel short of breath.  You have weakness or leg numbness.  You feel confused or have  trouble talking.  You have sudden changes in your vision. MAKE SURE YOU:  Understand these instructions.  Will watch your condition.  Will get help right away if you are not doing well or get worse. Document Released: 12/02/2004 Document Revised: 04/25/2012 Document Reviewed: 12/01/2011 [facility] Patient Information 2015 [place], [place]. This information is not intended to replace advice given to you by your health care Richard Berger. Make sure you discuss any questions you have with your health care Richard Berger.

## 2014-04-28 NOTE — Progress Notes (Addendum)
Subjective:  This chart was scribed for Lesle ChrisSteven Daub, MD by Elveria Risingimelie Horne, Medial Scribe. This patient was seen in room 11 and the patient's care was started at 8:33 AM.    Patient ID: Richard Berger, male    DOB: Nov 09, 1975, 38 y.o.   MRN: [id number]  HPI HPI Comments: Richard Berger is a 38 y.o. male who presents to the [facility] requesting complete physical exam. Patient is admitted smoker: 2 cigarettes a day. Patient is in need of updated vaccinations. Patient arrived from [place] 12 years ago. 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 Patient states that he snores, but denies apneic episodes.  Right ankle pain Patient shares history of gout. He is currently experiencing pain in his right ankle. Patient is taking Prednisone to manage his pain.    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 Moved from [place] to [place] in 2001 with his father.  Family (1 brother and 1 sister) in [place]. Mother died there 01/18/2013.      Children: one son in [place].      Employment: employed      Tobacco: 1-2 cigarettes a week.      Alcohol: alcohol on weekends.          Review of  Systems  Constitutional: Negative for fever and chills.  Musculoskeletal: Positive for arthralgias and joint swelling.  Neurological: Negative for weakness and numbness.       Objective:   Physical Exam  CONSTITUTIONAL: Well developed/well nourished; patient overweight , in no apparent distress HEAD: Normocephalic/atraumatic EYES: EOMI/PERRL; right corneal transplant ENMT: Mucous membranes moist NECK: supple no meningeal signs SPINE:entire spine nontender CV: S1/S2 noted, no murmurs/rubs/gallops noted LUNGS: Lungs are clear to auscultation bilaterally, no apparent distress ABDOMEN: soft, nontender, no rebound or guarding; extremely obese GU: no cva tenderness NEURO: Pt is awake/alert, moves all extremitiesx4 EXTREMITIES: pulses normal, full ROM; tenderness and swelling of right ankle with pain on flexion and extension; no lower extremity edema SKIN: warm, color normal PSYCH: no abnormalities of mood noted  Filed Vitals:   04/28/14 0825  BP: 130/80  Pulse: 97  Temp: 98.4 F (36.9 C)  Resp: 17   Results for orders placed in visit on 04/28/14  POCT CBC      Result Value Ref Range   WBC 7.9  4.6 - 10.2 K/uL   Lymph, poc 3.6 (*) 0.6 - 3.4   POC LYMPH PERCENT 45.9  10 - 50 %  L   MID (cbc) 0.8  0 - 0.9   POC MID % 9.8  0 - 12 %M   POC Granulocyte 3.5  2 - 6.9   Granulocyte percent 44.3  37 - 80 %G   RBC 5.32  4.69 - 6.13 M/uL   Hemoglobin 16.0  14.1 - 18.1 g/dL   HCT, POC 95.648.5  21.343.5 - 53.7 %   MCV 91.1  80 - 97 fL   MCH, POC 30.1  27 - 31.2 pg   MCHC 33.0  31.8 - 35.4 g/dL   RDW, POC 08.614.4     Platelet Count, POC 345  142 - 424 K/uL   MPV 7.7  0 - 99.8 fL  GLUCOSE, POCT (MANUAL RESULT ENTRY)      Result Value Ref Range   POC Glucose 73  70 - 99 mg/dl  POCT URINALYSIS DIPSTICK      Result Value Ref Range   Color, UA yellow     Clarity, UA clear     Glucose, UA neg     Bilirubin, UA neg     Ketones, UA neg     Spec Grav, UA 1.020     Blood, UA small     pH, UA 5.5      Protein, UA neg     Urobilinogen, UA 0.2     Nitrite, UA neg     Leukocytes, UA Negative          Assessment & Plan:  Stop his smoking. I encouraged him to exercise. He does only smoke 2 cigarettes a day. He is markedly obese and snores at night he really should have a sleep study done . He he needs to lose a significant amount of weight. His tetanus was updated. I personally performed the services described in this documentation, which was scribed in my presence. The recorded information has been reviewed and is accurate. He was also given a tapered dose of prednisone to take for his gout the

## 2014-05-22 ENCOUNTER — Ambulatory Visit (INDEPENDENT_AMBULATORY_CARE_PROVIDER_SITE_OTHER): Payer: PRIVATE HEALTH INSURANCE | Admitting: Neurology

## 2014-05-22 ENCOUNTER — Encounter: Payer: Self-pay | Admitting: Neurology

## 2014-05-22 ENCOUNTER — Encounter (INDEPENDENT_AMBULATORY_CARE_PROVIDER_SITE_OTHER): Payer: Self-pay

## 2014-05-22 VITALS — BP 128/84 | HR 83 | Temp 97.9°F | Ht 66.0 in | Wt 253.0 lb

## 2014-05-22 DIAGNOSIS — G2581 Restless legs syndrome: Secondary | ICD-10-CM

## 2014-05-22 DIAGNOSIS — G4733 Obstructive sleep apnea (adult) (pediatric): Secondary | ICD-10-CM

## 2014-05-22 DIAGNOSIS — G4761 Periodic limb movement disorder: Secondary | ICD-10-CM

## 2014-05-22 NOTE — Progress Notes (Addendum)
Subjective:    Patient ID: Richard Berger is a 38 y.o. male.  HPI    [name], MD, PhD [facility] [address], [address] [address] [place], [place] [address]  Dear Dr. Cleta Berger,    I saw your patient, Richard HawkingJessy Peaden, upon your kind request in my neurologic clinic today for initial consultation of his sleep disturbance, in particular his prior diagnosis of OSA. The patient is unaccompanied today. As you know, Mr. Richard Berger is a 38 yo right-handed gentleman with an underlying medical history of obesity, gout, right eye cataract, who is reported to snore. Apparently he was diagnosed with obstructive sleep apnea some years ago and could not tolerate CPAP. He has some language barrier and as far as I can tell, he actually never had a CPAP machine and does not wish to use CPAP and would prefer "something else". He works from 3:30 PM to 2 AM with the [employer] company. He goes to be around 4 AM and wakes up at 9-10 AM and usually feels adequately rested. I sounds like he has some RLS symptoms and is known to kick in his sleep and tends to rub his feet against he sheets. He has no AM HAs and denies significant EDS, with an ESS of 3/24. He does not drink caffeine daily. He quit smoking a month ago. He does not drink alcohol daily.  He naps sometimes. He has no TV in the bedroom. He snores loudly and per GF has witnessed apneas and his sleep study apparently showed significant OSA. He has not fallen asleep driving.   His Past Medical History Is Significant For: Past Medical History  Diagnosis Date  . Gout     Her Past Surgical History Is Significant For: Past Surgical History  Procedure Laterality Date  . Eye surgery Right     Cataract    His Family History Is Significant For: Family History  Problem Relation Age of Onset  . Family history unknown: Yes    His Social History Is Significant For: History   Social History  . Marital Status: Single    Spouse Name: n/a    Number  of Children: 1  . Years of Education: 8   Occupational History  . 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 Smoking status: Current Some Day Smoker    Types: Cigarettes  . Smokeless tobacco: Never Used  . Alcohol Use: 2.4 oz/week    2 Glasses of wine, 2 Cans of beer per week  . Drug Use: No  . Sexual Activity: No   Other Topics Concern  . None   Social History Narrative   Marital status: single; dating. Moved from [place] to [place] in 2001 with his father.  Family (1 brother and 1 sister) in [place]. Mother died there 01/18/2013.      Children: one son in [place].      Employment: employed      Tobacco: 1-2 cigarettes a week.      Alcohol: alcohol on weekends.         His Allergies Are:  No Known Allergies:   His Current Medications Are:  Outpatient Encounter Prescriptions as of 05/22/2014  Medication Sig  . predniSONE (DELTASONE) 20 MG tablet Take 3 a day for 3 days 2 a day for 3 days one a day for 3 days  :  Review of Systems:  Out of a complete 14 point review of systems,  all are reviewed and negative with the exception of these symptoms as listed below:  Review of Systems  Constitutional: Positive for fatigue.  HENT: Negative.   Eyes: Negative.   Respiratory: Negative.   Cardiovascular: Negative.   Gastrointestinal: Negative.   Endocrine: Positive for polydipsia.  Genitourinary: Negative.   Musculoskeletal: Negative.   Skin: Negative.   Allergic/Immunologic: Negative.   Neurological: Negative.   Hematological: Negative.   Psychiatric/Behavioral: Positive for sleep disturbance (snoring, insomnia, restless leg, shift work (2nd)).    Objective:  Neurologic Exam  Physical Exam Physical Examination:   Filed Vitals:   05/22/14 1130  BP: 128/84  Pulse: 83  Temp: 97.9 F (36.6 C)    General Examination: The patient is a very pleasant 38 y.o. male in no acute distress. He appears well-developed and well-nourished and  adequately groomed. He is morbidly obese.   HEENT: Normocephalic, atraumatic, pupils are equal, round and reactive to light and accommodation. Funduscopic exam is normal with sharp disc margins noted. Extraocular tracking is good without limitation to gaze excursion or nystagmus noted. Normal smooth pursuit is noted. Hearing is grossly intact. Tympanic membranes are clear bilaterally. Face is symmetric with normal facial animation and normal facial sensation. Speech is clear with no dysarthria noted. There is no hypophonia. There is no lip, neck/head, jaw or voice tremor. Neck is supple with full range of passive and active motion. There are no carotid bruits on auscultation. Oropharynx exam reveals: moderate mouth dryness, adequate dental hygiene and marked airway crowding, due to thick soft palate, large uvula and large. Mallampati is class III. Tongue protrudes centrally and palate elevates symmetrically. Tonsils are not visualized. Neck size is 18 7/8 inches. He has a very mild overbite. Nasal inspection reveals no significant nasal mucosal bogginess or redness and no septal deviation.   Chest: Clear to auscultation without wheezing, rhonchi or crackles noted.  Heart: S1+S2+0, regular and normal without murmurs, rubs or gallops noted.   Abdomen: Soft, non-tender and non-distended with normal bowel sounds appreciated on auscultation.  Extremities: There is no pitting edema in the distal lower extremities bilaterally. Pedal pulses are intact.  Skin: Warm and dry without trophic changes noted. There are no varicose veins.  Musculoskeletal: exam reveals no obvious joint deformities, tenderness or joint swelling or erythema.   Neurologically:  Mental status: The patient is awake, alert and oriented in all 4 spheres. His immediate and remote memory, attention, language skills and fund of knowledge are appropriate. There is no evidence of aphasia, agnosia, apraxia or anomia. Speech is clear with normal  prosody and enunciation. Thought process is linear. Mood is normal and affect is normal.  Cranial nerves II - XII are as described above under HEENT exam. In addition: shoulder shrug is normal with equal shoulder height noted. Motor exam: Normal bulk, strength and tone is noted. There is no drift, tremor or rebound. Romberg is negative. Reflexes are 2+ throughout. Fine motor skills and coordination: intact with normal finger taps, normal hand movements, normal rapid alternating patting, normal foot taps and normal foot agility.  Cerebellar testing: No dysmetria or intention tremor on finger to nose testing. Heel to shin is unremarkable bilaterally. There is no truncal or gait ataxia.  Sensory exam: intact to light touch, pinprick, vibration, temperature sense in the upper and lower extremities.  Gait, station and balance: He stands easily. No veering to one side is noted. No leaning to one side is noted. Posture is age-appropriate and stance is narrow based. Gait shows  normal stride length and normal pace. No problems turning are noted. He turns en bloc. Tandem walk is unremarkable. Intact toe and heel stance is noted.               Assessment and Plan:   In summary, Deveon A Kreuser is a very pleasant 38 y.o.-year old male with a history and physical exam concerning for obstructive sleep apnea (OSA). In fact, he has a prior diagnosis of obstructive sleep apnea some 4 years ago from what I understand but did not like CPAP and would prefer another option if possible. It sounds like he was diagnosed with at least moderate sleep apnea. In addition, he endorses some restless leg symptoms and is apparently also twitching in his sleep and rubs his feet against the sheets. He is advised that for severe sleep apnea, positive airway pressure is still the treatment of choice. He would be reluctant but willing to try it out again. I had a long chat with the patient about my findings and the diagnosis of OSA, its  prognosis and treatment options. We talked about medical treatments, surgical interventions and non-pharmacological approaches. I explained in particular the risks and ramifications of untreated moderate to severe OSA, especially with respect to developing cardiovascular disease down the Road, including congestive heart failure, difficult to treat hypertension, cardiac arrhythmias, or stroke. Even type 2 diabetes has, in part, been linked to untreated OSA. Symptoms of untreated OSA include daytime sleepiness, memory problems, mood irritability and mood disorder such as depression and anxiety, lack of energy, as well as recurrent headaches, especially morning headaches. We talked about trying to maintain a healthy lifestyle in general, as well as the importance of weight control. I encouraged the patient to eat healthy, exercise daily and keep well hydrated, to keep a scheduled bedtime and wake time routine, to not skip any meals and eat healthy snacks in between meals. I advised the patient not to drive when feeling sleepy.  I recommended the following at this time: sleep study with potential positive airway pressure titration.  I explained the sleep test procedure to the patient and also outlined possible surgical and non-surgical treatment options of OSA, including the use of a custom-made dental device (which would require a referral to a specialist dentist or oral surgeon), upper airway surgical options, such as pillar implants, radiofrequency surgery, tongue base surgery, and UPPP (which would involve a referral to an ENT surgeon). Rarely, jaw surgery such as mandibular advancement may be considered.  I also explained the CPAP treatment option to the patient, who indicated that he would be willing to try CPAP again if absolutely needed but generally speaking would prefer a different mode of treatment. Then again, he does not seem keen on exploring surgical options. I explained the importance of being  compliant with PAP treatment, not only for insurance purposes but primarily to improve His symptoms, and for the patient's long term health benefit, including to reduce His cardiovascular risks. I answered all his questions today and the patient was in agreement. I would like to see him back after the sleep study is completed and encouraged him to call with any interim questions, concerns, problems or updates.   Thank you very much for allowing me to participate in the care of this nice patient. If I can be of any further assistance to you please do not hesitate to call me at [phone].  Sincerely,   Huston Foley, MD, PhD

## 2014-05-22 NOTE — Patient Instructions (Signed)

## 2014-06-18 ENCOUNTER — Ambulatory Visit (INDEPENDENT_AMBULATORY_CARE_PROVIDER_SITE_OTHER): Payer: PRIVATE HEALTH INSURANCE | Admitting: Neurology

## 2014-06-18 DIAGNOSIS — G4733 Obstructive sleep apnea (adult) (pediatric): Secondary | ICD-10-CM

## 2014-06-18 DIAGNOSIS — G2581 Restless legs syndrome: Secondary | ICD-10-CM

## 2014-06-18 DIAGNOSIS — G4761 Periodic limb movement disorder: Secondary | ICD-10-CM

## 2014-06-19 ENCOUNTER — Encounter: Payer: Self-pay | Admitting: Neurology

## 2014-06-19 DIAGNOSIS — G4733 Obstructive sleep apnea (adult) (pediatric): Secondary | ICD-10-CM

## 2014-06-19 NOTE — Sleep Study (Signed)
See media tab for full report  Patient did not understand bedtime and morning questionnaires and did not fill out.

## 2014-06-27 ENCOUNTER — Ambulatory Visit (INDEPENDENT_AMBULATORY_CARE_PROVIDER_SITE_OTHER): Payer: PRIVATE HEALTH INSURANCE | Admitting: Family Medicine

## 2014-06-27 VITALS — BP 120/86 | HR 88 | Temp 97.7°F | Resp 16 | Ht 66.0 in | Wt 246.0 lb

## 2014-06-27 DIAGNOSIS — M109 Gout, unspecified: Secondary | ICD-10-CM

## 2014-06-27 DIAGNOSIS — E790 Hyperuricemia without signs of inflammatory arthritis and tophaceous disease: Secondary | ICD-10-CM | POA: Insufficient documentation

## 2014-06-27 DIAGNOSIS — R7989 Other specified abnormal findings of blood chemistry: Secondary | ICD-10-CM

## 2014-06-27 DIAGNOSIS — M10072 Idiopathic gout, left ankle and foot: Secondary | ICD-10-CM

## 2014-06-27 DIAGNOSIS — M25572 Pain in left ankle and joints of left foot: Secondary | ICD-10-CM

## 2014-06-27 DIAGNOSIS — M25579 Pain in unspecified ankle and joints of unspecified foot: Secondary | ICD-10-CM

## 2014-06-27 LAB — URIC ACID: URIC ACID, SERUM: 8.4 mg/dL — AB (ref 4.0–7.8)

## 2014-06-27 MED ORDER — ALLOPURINOL 300 MG PO TABS: 300.0000 mg | ORAL_TABLET | Freq: Every day | ORAL | Status: DC

## 2014-06-27 MED ORDER — DICLOFENAC SODIUM 75 MG PO TBEC: 75.0000 mg | DELAYED_RELEASE_TABLET | Freq: Two times a day (BID) | ORAL | Status: DC

## 2014-06-27 MED ORDER — PREDNISONE 20 MG PO TABS: ORAL_TABLET | ORAL | Status: DC

## 2014-06-27 NOTE — Progress Notes (Signed)
Subjective: 38 year old man who is here with pain in his left foot. He has a history of previously having some attacks of gout. He has not been on allopurinol in a longer, having quit taking it. When he has been treated with prednisone in the past that is, it down quickly. 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 We discussed shoes a little bit.  Objective: Pulses present in foot. No edema. Tender in the ankle but also primarily tender in the first MTP joint which is swollen and warm.  Assessment: Acute gouty arthritis Foot pain Hyperuricemia  Plan: Check uric acid level Treat for any acute gout, but also advised him to be on long-term allopurinol for that prevention of flareups.

## 2014-06-27 NOTE — Patient Instructions (Signed)
Take prednisone as directed to calm down the acute gout.  Take diclofenac one twice daily for pain and inflammation  Wait for one week, then begin taking allopurinol 300 mg one daily with food.  Return in 3 months for [place] to recheck labs including the uric acid and liver enzyme tests to be certain your body is tolerating the allopurinol well and at the level is coming down like he should.  Return sooner if problems  Work on eating less and doing some regular walking to try and lose weight. Avoid excessive beer which may be adding to your gout problem.  Gout Gout is an inflammatory arthritis caused by a buildup of uric acid crystals in the joints. Uric acid is a chemical that is normally present in the blood. When the level of uric acid in the blood is too high it can form crystals that deposit in your joints and tissues. This causes joint redness, soreness, and swelling (inflammation). Repeat attacks are common. Over time, uric acid crystals can form into masses (tophi) near a joint, destroying bone and causing disfigurement. Gout is treatable and often preventable. CAUSES  The disease begins with elevated levels of uric acid in the blood. Uric acid is produced by your body when it breaks down a naturally found substance called purines. Certain foods you eat, such as meats and fish, contain high amounts of purines. Causes of an elevated uric acid level include:  Being passed down from parent to child (heredity).  Diseases that cause increased uric acid production (such as obesity, psoriasis, and certain cancers).  Excessive alcohol use.  Diet, especially diets rich in meat and seafood.  Medicines, including certain cancer-fighting medicines (chemotherapy), water pills (diuretics), and aspirin.  Chronic kidney disease. The kidneys are no longer able to remove uric acid well.  Problems with metabolism. Conditions strongly associated with gout include:  Obesity.  High blood  pressure.  High cholesterol.  Diabetes. Not everyone with elevated uric acid levels gets gout. It is not understood why some people get gout and others do not. Surgery, joint injury, and eating too much of certain foods are some of the factors that can lead to gout attacks. SYMPTOMS   An attack of gout comes on quickly. It causes intense pain with redness, swelling, and warmth in a joint.  Fever can occur.  Often, only one joint is involved. Certain joints are more commonly involved:  Base of the big toe.  Knee.  Ankle.  Wrist.  Finger. Without treatment, an attack usually goes away in a few days to weeks. Between attacks, you usually will not have symptoms, which is different from many other forms of arthritis. DIAGNOSIS  Your caregiver will suspect gout based on your symptoms and exam. In some cases, tests may be recommended. The tests may include:  Blood tests.  Urine tests.  X-rays.  Joint fluid exam. This exam requires a needle to remove fluid from the joint (arthrocentesis). Using a microscope, gout is confirmed when uric acid crystals are seen in the joint fluid. TREATMENT  There are two phases to gout treatment: treating the sudden onset (acute) attack and preventing attacks (prophylaxis).  Treatment of an Acute Attack.  Medicines are used. These include anti-inflammatory medicines or steroid medicines.  An injection of steroid medicine into the affected joint is sometimes necessary.  The painful joint is rested. Movement can worsen the arthritis.  You may use warm or cold treatments on painful joints, depending which works best for you.  Treatment to Prevent Attacks.  If you suffer from frequent gout attacks, your caregiver may advise preventive medicine. These medicines are started after the acute attack subsides. These medicines either help your kidneys eliminate uric acid from your body or decrease your uric acid production. You may need to stay on these  medicines for a very long time.  The early phase of treatment with preventive medicine can be associated with an increase in acute gout attacks. For this reason, during the first few months of treatment, your caregiver may also advise you to take medicines usually used for acute gout treatment. Be sure you understand your caregiver's directions. Your caregiver may make several adjustments to your medicine dose before these medicines are effective.  Discuss dietary treatment with your caregiver or dietitian. Alcohol and drinks high in sugar and fructose and foods such as meat, poultry, and seafood can increase uric acid levels. Your caregiver or dietitian can advise you on drinks and foods that should be limited. HOME CARE INSTRUCTIONS   Do not take aspirin to relieve pain. This raises uric acid levels.  Only take over-the-counter or prescription medicines for pain, discomfort, or fever as directed by your caregiver.  Rest the joint as much as possible. When in bed, keep sheets and blankets off painful areas.  Keep the affected joint raised (elevated).  Apply warm or cold treatments to painful joints. Use of warm or cold treatments depends on which works best for you.  Use crutches if the painful joint is in your leg.  Drink enough fluids to keep your urine clear or pale yellow. This helps your body get rid of uric acid. Limit alcohol, sugary drinks, and fructose drinks.  Follow your dietary instructions. Pay careful attention to the amount of protein you eat. Your daily diet should emphasize fruits, vegetables, whole grains, and fat-free or low-fat milk products. Discuss the use of coffee, vitamin C, and cherries with your caregiver or dietitian. These may be helpful in lowering uric acid levels.  Maintain a healthy body weight. SEEK MEDICAL CARE IF:   You develop diarrhea, vomiting, or any side effects from medicines.  You do not feel better in 24 hours, or you are getting worse. SEEK  IMMEDIATE MEDICAL CARE IF:   Your joint becomes suddenly more tender, and you have chills or a fever. MAKE SURE YOU:   Understand these instructions.  Will watch your condition.  Will get help right away if you are not doing well or get worse. Document Released: 10/22/2000 Document Revised: 03/11/2014 Document Reviewed: 06/07/2012 [facility] Patient Information 2015 [employer] [place]. This information is not intended to replace advice given to you by your health care provider. Make sure you discuss any questions you have with your health care provider.

## 2014-06-28 ENCOUNTER — Encounter: Payer: Self-pay | Admitting: Family Medicine

## 2014-07-05 ENCOUNTER — Telehealth: Payer: Self-pay | Admitting: Neurology

## 2014-07-05 DIAGNOSIS — G4733 Obstructive sleep apnea (adult) (pediatric): Secondary | ICD-10-CM

## 2014-07-05 DIAGNOSIS — E669 Obesity, unspecified: Secondary | ICD-10-CM

## 2014-07-05 NOTE — Telephone Encounter (Signed)
Please call and notify patient that the recent sleep study confirmed the diagnosis of OSA. He did well with CPAP during the study with significant improvement of the respiratory events. Therefore, I would like start the patient on CPAP at home. I placed the order in the chart.  The patient had indicated to me that he does not wish to try CPAP. However, he has severe sleep apnea and CPAP is the best treatment option for the degree of sleep apnea he has. He did rather well with CPAP during the sleep study and I encourage him strongly to try it at home.  Arrange for CPAP set up at home through a DME company of patient's choice and fax/route report to PCP and referring MD (if other than PCP).   The patient will also need a follow up appointment with me in 6-8 weeks post set up that has to be scheduled; help the patient schedule this (in a follow-up slot).   Please re-enforce the importance of compliance with treatment and the need for [place] to monitor compliance data.   Once you have spoken to the patient and scheduled the return appointment, you may close this encounter, thanks,   Huston Foley, MD, PhD [facility] ([facility])

## 2014-07-10 ENCOUNTER — Encounter: Payer: Self-pay | Admitting: Neurology

## 2014-07-10 NOTE — Telephone Encounter (Signed)
I called the patient and explained the findings of his sleep study report.  He understands the diagnosis of severe sleep apnea.  He has agreed to try CPAP therapy as recommended by Dr. Frances Furbish.  He understands that he will be contacted by [facility] to begin CPAP setup and therapy.  An order has been sent to [facility] to begin processing.  A copy of this sleep study report will be sent to the referring provider.  The patient will be mailed his sleep study test results.

## 2014-09-30 ENCOUNTER — Ambulatory Visit (INDEPENDENT_AMBULATORY_CARE_PROVIDER_SITE_OTHER): Payer: PRIVATE HEALTH INSURANCE | Admitting: Emergency Medicine

## 2014-09-30 VITALS — BP 116/90 | HR 84 | Temp 97.8°F | Resp 18 | Ht 66.0 in | Wt 252.0 lb

## 2014-09-30 DIAGNOSIS — M109 Gout, unspecified: Secondary | ICD-10-CM

## 2014-09-30 DIAGNOSIS — M10072 Idiopathic gout, left ankle and foot: Secondary | ICD-10-CM

## 2014-09-30 DIAGNOSIS — E669 Obesity, unspecified: Secondary | ICD-10-CM

## 2014-09-30 MED ORDER — INDOMETHACIN 25 MG PO CAPS: 25.0000 mg | ORAL_CAPSULE | Freq: Three times a day (TID) | ORAL | Status: DC

## 2014-09-30 MED ORDER — COLCHICINE 0.6 MG PO TABS: ORAL_TABLET | ORAL | Status: DC

## 2014-09-30 MED ORDER — PREDNISONE 20 MG PO TABS: ORAL_TABLET | ORAL | Status: DC

## 2014-09-30 NOTE — Patient Instructions (Signed)

## 2014-09-30 NOTE — Progress Notes (Signed)
[facility] [address], [place] [address] [phone]  Date:  09/30/2014   Name:  Richard AlaJessy A Berger   DOB:  [birth year]   MRN:  [id number]  PCP:  Tally DueGUEST, CHRIS WARREN, MD    Chief Complaint: Foot Pain   History of Present Illness:  Richard Berger is a 38 y.o. very pleasant male patient who presents with the following:  History of gout.  Out of medications. Having pain in right ankle for four days.  No history of injury. Multiple exacerbations. Requesting retreatment with prednisone No improvement with over the counter medications or other home remedies. Denies other complaint or health concern today.   Patient Active Problem List   Diagnosis Date Noted  . Hyperuricemia 06/27/2014  . 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 Gout 06/09/2012    Past Medical History  Diagnosis Date  . Gout     Past Surgical History  Procedure Laterality Date  . Eye surgery Right     Cataract    History  Substance Use Topics  . Smoking status: Current Some Day Smoker    Types: Cigarettes  . Smokeless tobacco: Never Used  . Alcohol Use: 2.4 oz/week    2 Glasses of wine, 2 Cans of beer per week    History reviewed. No pertinent family history.  No Known Allergies  Medication list has been reviewed and updated.  Current Outpatient Prescriptions on File Prior to Visit  Medication Sig Dispense Refill  . allopurinol (ZYLOPRIM) 300 MG tablet Take 1 tablet (300 mg total) by mouth daily. (Patient not taking: Reported on 09/30/2014) 30 tablet 5  . diclofenac (VOLTAREN) 75 MG EC tablet Take 1 tablet (75 mg total) by mouth 2 (two) times daily. (Patient not taking: Reported on 09/30/2014) 30 tablet 0  . predniSONE (DELTASONE) 20 MG tablet Take 3 a day for 3 days 2 a day for 3 days one a day for 3 days (Patient not taking: Reported on 09/30/2014) 18 tablet 0   No current facility-administered medications on file prior to visit.    Review of Systems:  As per HPI,  otherwise negative.    Physical Examination: Filed Vitals:   09/30/14 1119  BP: 116/90  Pulse: 84  Temp: 97.8 F (36.6 C)  Resp: 18   Filed Vitals:   09/30/14 1119  Height: 5\' 6"  (1.676 m)  Weight: 252 lb (114.306 kg)   Body mass index is 40.69 kg/(m^2). Ideal Body Weight: Weight in (lb) to have BMI = 25: 154.6   GEN: morbid obesity, NAD, Non-toxic, Alert & Oriented x 3 HEENT: Atraumatic, Normocephalic.  Ears and Nose: No external deformity. EXTR: No clubbing/cyanosis/edema NEURO: Normal gait.  PSYCH: Normally interactive. Conversant. Not depressed or anxious appearing.  Calm demeanor.  Marked tenderness over ankle joint.  No erythema.  Mild edema   Assessment and Plan: Gout exacerbation Prednisone Colchicine  Signed,  Phillips OdorJeffery Allia Wiltsey, MD

## 2014-11-16 ENCOUNTER — Ambulatory Visit (INDEPENDENT_AMBULATORY_CARE_PROVIDER_SITE_OTHER): Payer: PRIVATE HEALTH INSURANCE | Admitting: Emergency Medicine

## 2014-11-16 VITALS — BP 114/84 | HR 101 | Temp 98.3°F | Resp 18 | Ht 66.0 in | Wt 254.2 lb

## 2014-11-16 DIAGNOSIS — E669 Obesity, unspecified: Secondary | ICD-10-CM

## 2014-11-16 DIAGNOSIS — M109 Gout, unspecified: Secondary | ICD-10-CM

## 2014-11-16 DIAGNOSIS — M25572 Pain in left ankle and joints of left foot: Secondary | ICD-10-CM

## 2014-11-16 DIAGNOSIS — M10072 Idiopathic gout, left ankle and foot: Secondary | ICD-10-CM

## 2014-11-16 MED ORDER — COLCHICINE 0.6 MG PO TABS: ORAL_TABLET | ORAL | Status: DC

## 2014-11-16 MED ORDER — INDOMETHACIN 25 MG PO CAPS: 25.0000 mg | ORAL_CAPSULE | Freq: Three times a day (TID) | ORAL | Status: DC

## 2014-11-16 NOTE — Patient Instructions (Signed)

## 2014-11-16 NOTE — Progress Notes (Signed)
[facility] [address], [place] [address] [phone]  Date:  11/16/2014   Name:  Richard Berger   DOB:  [birth year]   MRN:  [id number]  PCP:  Tally DueGUEST, CHRIS WARREN, MD    Chief Complaint: Gout   History of Present Illness:  Richard Berger is a 39 y.o. very pleasant male patient who presents with the following:  Recurrent gout.  Not on medication.  Has pain, tenderness and swelling in the left great MTP joint No history of injury No improvement with over the counter medications or other home remedies.  Denies other complaint or health concern today.   Patient Active Problem List   Diagnosis Date Noted  . Hyperuricemia 06/27/2014  . 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 Gout 06/09/2012    Past Medical History  Diagnosis Date  . Gout     Past Surgical History  Procedure Laterality Date  . Eye surgery Right     Cataract    History  Substance Use Topics  . Smoking status: Current Some Day Smoker    Types: Cigarettes  . Smokeless tobacco: Never Used  . Alcohol Use: 2.4 oz/week    2 Glasses of wine, 2 Cans of beer per week    History reviewed. No pertinent family history.  No Known Allergies  Medication list has been reviewed and updated.  Current Outpatient Prescriptions on File Prior to Visit  Medication Sig Dispense Refill  . allopurinol (ZYLOPRIM) 300 MG tablet Take 1 tablet (300 mg total) by mouth daily. (Patient not taking: Reported on 09/30/2014) 30 tablet 5  . colchicine 0.6 MG tablet 2 now and one in one hour.  One daily until pain relieved (Patient not taking: Reported on 11/16/2014) 30 tablet 0  . indomethacin (INDOCIN) 25 MG capsule Take 1 capsule (25 mg total) by mouth 3 (three) times daily with meals. (Patient not taking: Reported on 11/16/2014) 30 capsule 0   No current facility-administered medications on file prior to visit.    Review of Systems:  As per HPI, otherwise negative.    Physical Examination: Filed Vitals:   11/16/14 1000  BP: 114/84  Pulse: 101  Temp: 98.3 F (36.8 C)  Resp: 18   Filed Vitals:   11/16/14 1000  Height: 5\' 6"  (1.676 m)  Weight: 254 lb 3.2 oz (115.304 kg)   Body mass index is 41.05 kg/(m^2). Ideal Body Weight: Weight in (lb) to have BMI = 25: 154.6   GEN: obesity, NAD, Non-toxic, Alert & Oriented x 3 HEENT: Atraumatic, Normocephalic.  Ears and Nose: No external deformity. EXTR: No clubbing/cyanosis/edema NEURO: Normal gait.  PSYCH: Normally interactive. Conversant. Not depressed or anxious appearing.  Calm demeanor.  Left MTP joint great toe swollen and exquisitely tender.  No deformity  Assessment and Plan: Gout  Indocin Colchicine Elevate  Signed,  Phillips OdorJeffery Moussa Wiegand, MD

## 2014-11-17 ENCOUNTER — Ambulatory Visit (INDEPENDENT_AMBULATORY_CARE_PROVIDER_SITE_OTHER): Payer: PRIVATE HEALTH INSURANCE | Admitting: Emergency Medicine

## 2014-11-17 VITALS — BP 130/90 | HR 101 | Temp 98.5°F | Resp 18 | Ht 66.0 in | Wt 253.0 lb

## 2014-11-17 DIAGNOSIS — M109 Gout, unspecified: Secondary | ICD-10-CM

## 2014-11-17 MED ORDER — PREDNISONE 10 MG PO KIT: PACK | ORAL | Status: DC

## 2014-11-17 NOTE — Patient Instructions (Signed)

## 2014-11-17 NOTE — Progress Notes (Signed)
[facility] [address], [place] [address] [phone]  Date:  11/17/2014   Name:  Richard Berger   DOB:  09/13/1976   MRN:  [id number]  PCP:  Tally DueGUEST, CHRIS WARREN, MD    Chief Complaint: Gout and Foot Pain   History of Present Illness:  Richard Berger is a 39 y.o. very pleasant male patient who presents with the following:  Seen yesterday for gout and says no relief with meds.  Very insistent that he receive a prescription for prednisone Claims no improvement. Up all night with pain No improvement with over the counter medications or other home remedies. Denies other complaint or health concern today.   Patient Active Problem List   Diagnosis Date Noted  . Hyperuricemia 06/27/2014  . 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 Gout 06/09/2012    Past Medical History  Diagnosis Date  . Gout     Past Surgical History  Procedure Laterality Date  . Eye surgery Right     Cataract    History  Substance Use Topics  . Smoking status: Current Some Day Smoker    Types: Cigarettes  . Smokeless tobacco: Never Used  . Alcohol Use: 2.4 oz/week    2 Glasses of wine, 2 Cans of beer per week    No family history on file.  No Known Allergies  Medication list has been reviewed and updated.  Current Outpatient Prescriptions on File Prior to Visit  Medication Sig Dispense Refill  . colchicine 0.6 MG tablet 2 now and one in one hour.  One daily until pain relieved 30 tablet 1  . indomethacin (INDOCIN) 25 MG capsule Take 1 capsule (25 mg total) by mouth 3 (three) times daily with meals. 60 capsule 1  . allopurinol (ZYLOPRIM) 300 MG tablet Take 1 tablet (300 mg total) by mouth daily. (Patient not taking: Reported on 09/30/2014) 30 tablet 5   No current facility-administered medications on file prior to visit.    Review of Systems:  As per HPI, otherwise negative.    Physical Examination: Filed Vitals:   11/17/14 0821  BP: 130/90  Pulse: 101  Temp:  98.5 F (36.9 C)  Resp: 18   Filed Vitals:   11/17/14 0821  Height: 5\' 6"  (1.676 m)  Weight: 253 lb (114.76 kg)   Body mass index is 40.85 kg/(m^2). Ideal Body Weight: Weight in (lb) to have BMI = 25: 154.6   GEN: WDWN, NAD, Non-toxic, Alert & Oriented x 3 HEENT: Atraumatic, Normocephalic.  Ears and Nose: No external deformity. EXTR: No clubbing/cyanosis/edema NEURO: Normal gait.  PSYCH: Normally interactive. Conversant. Not depressed or anxious appearing.  Calm demeanor.  Left foot Tender and swollen.  No cellulitis  Assessment and Plan: Gout  Signed,  Phillips OdorJeffery Anderson, MD

## 2015-01-17 ENCOUNTER — Ambulatory Visit (INDEPENDENT_AMBULATORY_CARE_PROVIDER_SITE_OTHER): Payer: PRIVATE HEALTH INSURANCE | Admitting: Family Medicine

## 2015-01-17 VITALS — BP 122/88 | HR 95 | Temp 98.0°F | Resp 16 | Ht 66.0 in | Wt 255.0 lb

## 2015-01-17 DIAGNOSIS — E790 Hyperuricemia without signs of inflammatory arthritis and tophaceous disease: Secondary | ICD-10-CM | POA: Diagnosis not present

## 2015-01-17 DIAGNOSIS — M10071 Idiopathic gout, right ankle and foot: Secondary | ICD-10-CM | POA: Diagnosis not present

## 2015-01-17 DIAGNOSIS — E669 Obesity, unspecified: Secondary | ICD-10-CM | POA: Diagnosis not present

## 2015-01-17 MED ORDER — ALLOPURINOL 300 MG PO TABS: 300.0000 mg | ORAL_TABLET | Freq: Every day | ORAL | Status: DC

## 2015-01-17 MED ORDER — COLCHICINE 0.6 MG PO TABS: ORAL_TABLET | ORAL | Status: DC

## 2015-01-17 MED ORDER — PREDNISONE 10 MG PO KIT: PACK | ORAL | Status: DC

## 2015-01-17 NOTE — Patient Instructions (Signed)
For your acute gout attack use the prednisone pack, you can also take 2 colchicine pills now and one in another hour Let us know if you foot is not better soon!  Once you are over your acute attack you can start on allopurinol once a day to try and prevent more gout attacks Losing weight and avoiding your trigger foods will also help cut down on your gout attacks

## 2015-01-17 NOTE — Progress Notes (Signed)
[facility] [address] [place] [address] [phone]  Date:  01/17/2015   Name:  Richard Berger   DOB:  03/15/1976   MRN:  [id number]  PCP:  Kennon Portela, MD    Chief Complaint: Gout   History of Present Illness:  Richard Berger is a 39 y.o. very pleasant male patient who presents with the following:  History of gout.  Last here in January with an attack in his left foot.  Last night after work he noted pain in his right foot.  He rested it on a pillow but it seemed worse this am.  He knows that he has gout again. He did not injury the foot but did eat some shrimp.  He did not take any medication for this as of yet.  He is not taking allopurinolol now, was not sure what this is for He has noted that prednisone seems to helps him the most.   He has had several recent gout attacks  Patient Active Problem List   Diagnosis Date Noted  . Hyperuricemia 06/27/2014  . 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 Gout 06/09/2012    Past Medical History  Diagnosis Date  . Gout     Past Surgical History  Procedure Laterality Date  . Eye surgery Right     Cataract    History  Substance Use Topics  . Smoking status: Current Some Day Smoker    Types: Cigarettes  . Smokeless tobacco: Never Used  . Alcohol Use: 2.4 oz/week    2 Glasses of wine, 2 Cans of beer per week    No family history on file.  No Known Allergies  Medication list has been reviewed and updated.  Current Outpatient Prescriptions on File Prior to Visit  Medication Sig Dispense Refill  . colchicine 0.6 MG tablet 2 now and one in one hour.  One daily until pain relieved 30 tablet 1  . indomethacin (INDOCIN) 25 MG capsule Take 1 capsule (25 mg total) by mouth 3 (three) times daily with meals. 60 capsule 1  . PredniSONE 10 MG KIT Take as directed 21 each 0  . allopurinol (ZYLOPRIM) 300 MG tablet Take 1 tablet (300 mg total) by mouth daily. (Patient not taking: Reported on 09/30/2014)  30 tablet 5   No current facility-administered medications on file prior to visit.    Review of Systems:  As per HPI- otherwise negative.   Physical Examination: Filed Vitals:   01/17/15 0843  BP: 122/88  Pulse: 95  Temp: 98 F (36.7 C)  Resp: 16   Filed Vitals:   01/17/15 0843  Height: '5\' 6"'  (1.676 m)  Weight: 255 lb (115.667 kg)   Body mass index is 41.18 kg/(m^2). Ideal Body Weight: Weight in (lb) to have BMI = 25: 154.6  GEN: WDWN, NAD, Non-toxic, A & O x 3, obese HEENT: Atraumatic, Normocephalic. Neck supple. No masses, No LAD. Ears and Nose: No external deformity. CV: RRR, No M/G/R. No JVD. No thrill. No extra heart sounds. PULM: CTA B, no wheezes, crackles, rhonchi. No retractions. No resp. distress. No accessory muscle use. ABD: S, NT, ND, +BS. No rebound. No HSM. EXTR: No c/c/e NEURO favoring right and wearing a post-op shoe that he brought from home PSYCH: Normally interactive. Conversant. Not depressed or anxious appearing.  Calm demeanor.  Right foot: mild swelling and diffuse tenderness over the dorsum of the foot and at the bilateral ankle.  No heat or  redness   Assessment and Plan: Acute idiopathic gout of right foot - Plan: colchicine 0.6 MG tablet, PredniSONE 10 MG KIT  Hyperuricemia - Plan: allopurinol (ZYLOPRIM) 300 MG tablet  Obesity  No NSAIDs while on prednisone  Encouraged him to come in for a uric acid level once he is on the allopurinol for a few months Encouraged weight loss   Signed Lamar Blinks, MD

## 2015-01-19 ENCOUNTER — Other Ambulatory Visit: Payer: Self-pay | Admitting: Family Medicine

## 2015-02-22 ENCOUNTER — Ambulatory Visit (INDEPENDENT_AMBULATORY_CARE_PROVIDER_SITE_OTHER): Payer: PRIVATE HEALTH INSURANCE | Admitting: Emergency Medicine

## 2015-02-22 VITALS — BP 114/82 | HR 93 | Temp 97.8°F | Resp 18 | Ht 65.0 in | Wt 249.4 lb

## 2015-02-22 DIAGNOSIS — S3992XA Unspecified injury of lower back, initial encounter: Secondary | ICD-10-CM

## 2015-02-22 MED ORDER — NAPROXEN SODIUM 550 MG PO TABS: 550.0000 mg | ORAL_TABLET | Freq: Two times a day (BID) | ORAL | Status: DC

## 2015-02-22 MED ORDER — CYCLOBENZAPRINE HCL 10 MG PO TABS: 10.0000 mg | ORAL_TABLET | Freq: Three times a day (TID) | ORAL | Status: DC | PRN

## 2015-02-22 MED ORDER — HYDROCODONE-ACETAMINOPHEN 5-325 MG PO TABS: 1.0000 | ORAL_TABLET | ORAL | Status: DC | PRN

## 2015-02-22 NOTE — Progress Notes (Signed)
[facility] [address], [place] [address] [phone]  Date:  02/22/2015   Name:  Richard Berger   DOB:  02/28/1976   MRN:  [id number]  PCP:  Kennon Portela, MD    Chief Complaint: Back Pain   History of Present Illness:  Richard Berger is a 39 y.o. very pleasant male patient who presents with the following:  Works in a warehouse No history of discrete injury or overuse 3 day history of low back pain  Has no neuro symptoms No relieving factors Worse with bending or standing. Radiation to the back of his thighs No improvement with over the counter medications or other home remedies.  Denies other complaint or health concern today.   Patient Active Problem List   Diagnosis Date Noted  . Obesity 01/17/2015  . Hyperuricemia 06/27/2014  . 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 Gout 06/09/2012    Past Medical History  Diagnosis Date  . Gout     Past Surgical History  Procedure Laterality Date  . Eye surgery Right     Cataract    History  Substance Use Topics  . Smoking status: Current Some Day Smoker    Types: Cigarettes  . Smokeless tobacco: Never Used  . Alcohol Use: 2.4 oz/week    2 Glasses of wine, 2 Cans of beer per week    History reviewed. No pertinent family history.  No Known Allergies  Medication list has been reviewed and updated.  Current Outpatient Prescriptions on File Prior to Visit  Medication Sig Dispense Refill  . allopurinol (ZYLOPRIM) 300 MG tablet Take 1 tablet (300 mg total) by mouth daily. Use to prevent gout (Patient not taking: Reported on 02/22/2015) 30 tablet 5  . colchicine 0.6 MG tablet 2 now and one in one hour.  One daily until pain relieved (Patient not taking: Reported on 02/22/2015) 30 tablet 1  . indomethacin (INDOCIN) 25 MG capsule Take 1 capsule (25 mg total) by mouth 3 (three) times daily with meals. (Patient not taking: Reported on 02/22/2015) 60 capsule 1  . PredniSONE 10 MG KIT Take as  directed (Patient not taking: Reported on 02/22/2015) 21 each 0   No current facility-administered medications on file prior to visit.    Review of Systems:  As per HPI, otherwise negative.    Physical Examination: Filed Vitals:   02/22/15 0914  BP: 114/82  Pulse: 93  Temp: 97.8 F (36.6 C)  Resp: 18   Filed Vitals:   02/22/15 0914  Height: '5\' 5"'  (1.651 m)  Weight: 249 lb 6.4 oz (113.127 kg)   Body mass index is 41.5 kg/(m^2). Ideal Body Weight: Weight in (lb) to have BMI = 25: 149.9   GEN: morbid obesity, NAD, Non-toxic, Alert & Oriented x 3 HEENT: Atraumatic, Normocephalic.  Ears and Nose: No external deformity. EXTR: No clubbing/cyanosis/edema NEURO: Normal gait.  PSYCH: Normally interactive. Conversant. Not depressed or anxious appearing.  Calm demeanor.  Back:  Tender across waistline.  Motor and neuro intact  Assessment and Plan: Lumbar strain Anaprox Flexeril norco Local heat  Signed,  Ellison Carwin, MD

## 2015-02-22 NOTE — Patient Instructions (Signed)

## 2015-03-04 ENCOUNTER — Ambulatory Visit (INDEPENDENT_AMBULATORY_CARE_PROVIDER_SITE_OTHER): Payer: PRIVATE HEALTH INSURANCE | Admitting: Family Medicine

## 2015-03-04 ENCOUNTER — Ambulatory Visit (INDEPENDENT_AMBULATORY_CARE_PROVIDER_SITE_OTHER): Payer: PRIVATE HEALTH INSURANCE

## 2015-03-04 VITALS — BP 124/90 | HR 98 | Temp 98.5°F | Resp 16 | Ht 65.0 in | Wt 254.0 lb

## 2015-03-04 DIAGNOSIS — M5442 Lumbago with sciatica, left side: Secondary | ICD-10-CM | POA: Diagnosis not present

## 2015-03-04 DIAGNOSIS — R292 Abnormal reflex: Secondary | ICD-10-CM | POA: Diagnosis not present

## 2015-03-04 DIAGNOSIS — M5441 Lumbago with sciatica, right side: Secondary | ICD-10-CM

## 2015-03-04 LAB — POCT URINALYSIS DIPSTICK
BILIRUBIN UA: NEGATIVE
Glucose, UA: NEGATIVE
Ketones, UA: NEGATIVE
Leukocytes, UA: NEGATIVE
Nitrite, UA: NEGATIVE
Protein, UA: NEGATIVE
Spec Grav, UA: <=1.005
Urobilinogen, UA: 0.2
pH, UA: 6

## 2015-03-04 LAB — POCT UA - MICROSCOPIC ONLY
BACTERIA, U MICROSCOPIC: NEGATIVE
Bacteria, U Microscopic: NEGATIVE
Casts, Ur, LPF, POC: NEGATIVE
Casts, Ur, LPF, POC: NEGATIVE
Crystals, Ur, HPF, POC: NEGATIVE
Crystals, Ur, HPF, POC: NEGATIVE
Epithelial cells, urine per micros: NEGATIVE
Mucus, UA: NEGATIVE
Mucus, UA: NEGATIVE
RBC, urine, microscopic: NEGATIVE
WBC, UR, HPF, POC: NEGATIVE
WBC, Ur, HPF, POC: NEGATIVE
YEAST UA: NEGATIVE
Yeast, UA: NEGATIVE

## 2015-03-04 MED ORDER — CYCLOBENZAPRINE HCL 10 MG PO TABS
10.0000 mg | ORAL_TABLET | Freq: Three times a day (TID) | ORAL | Status: DC | PRN
Start: 2015-03-04 — End: 2015-03-22

## 2015-03-04 MED ORDER — PREDNISONE 20 MG PO TABS: ORAL_TABLET | ORAL | Status: DC

## 2015-03-04 NOTE — Progress Notes (Signed)
Subjective:    Patient ID: Richard Berger, male    DOB: 09-07-1976, 39 y.o.   MRN: [id number]  HPI  This is a 39 year old male presenting for follow up low back pain. He was seen here 10 days ago by Dr. Dareen PianoAnderson. Diagnosed with lumbar strain with sciatica. Prescribed norco, flexeril and anaprox. Pt reports his pain has stayed the same - no better, no worse. Taking anaprox BID, flexeril TID and norco BID. Shooting pain down back of leg to level of mid-calf. Shooting pain worse in right than left. Pain in back located to bilateral lower back around waistline. Denies weakness, paresthesias or problems with bowel or bladder. Had similar back pain 5 years ago that resolved after 3 days. Denies dysuria or hematuria.   Review of Systems  Constitutional: Negative for fever and chills.  Gastrointestinal: Negative for nausea, vomiting and abdominal pain.  Genitourinary: Negative for dysuria and hematuria.  Musculoskeletal: Positive for myalgias and back pain.  Skin: Negative for color change.  Neurological: Negative for weakness and numbness.  Hematological: Negative for adenopathy.    Patient Active Problem List   Diagnosis Date Noted  . Obesity 01/17/2015  . Hyperuricemia 06/27/2014  . 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 Gout 06/09/2012   Prior to Admission medications   Medication Sig Start Date End Date Taking? Authorizing Provider  cyclobenzaprine (FLEXERIL) 10 MG tablet Take 1 tablet (10 mg total) by mouth 3 (three) times daily as needed for muscle spasms. 02/22/15  Yes Carmelina DaneJeffery S Anderson, MD  HYDROcodone-acetaminophen (NORCO) 5-325 MG per tablet Take 1-2 tablets by mouth every 4 (four) hours as needed. 02/22/15  Yes Carmelina DaneJeffery S Anderson, MD  naproxen sodium (ANAPROX DS) 550 MG tablet Take 1 tablet (550 mg total) by mouth 2 (two) times daily with a meal. 02/22/15 02/22/16 Yes Carmelina DaneJeffery S Anderson, MD                        No Known Allergies  Patient's social and family history were  reviewed.     Objective:   Physical Exam  Constitutional: He is oriented to person, place, and time. He appears well-developed and well-nourished. No distress.  HENT:  Head: Normocephalic and atraumatic.  Right Ear: Hearing normal.  Left Ear: Hearing normal.  Nose: Nose normal.  Eyes: Conjunctivae and lids are normal. Right eye exhibits no discharge. Left eye exhibits no discharge. No scleral icterus.  Cardiovascular: Normal rate, regular rhythm, normal heart sounds and normal pulses.   No murmur heard. Pulmonary/Chest: Effort normal and breath sounds normal. No respiratory distress. He has no wheezes. He has no rhonchi. He has no rales.  Abdominal: Soft. Normal appearance. There is no tenderness. There is CVA tenderness.  Musculoskeletal:       Lumbar back: He exhibits decreased range of motion (extension and flexion), tenderness (diffusely tender along waistline and paraspinal) and bony tenderness (midline lower lumbar). He exhibits no swelling.       Back:  Straight leg raise positive on right side  Lymphadenopathy:       Head (right side): No submental, no submandibular and no tonsillar adenopathy present.       Head (left side): No submental, no submandibular and no tonsillar adenopathy present.    He has no cervical adenopathy.  Neurological: He is alert and oriented to person, place, and time. He has normal strength. No sensory deficit. Gait normal.  Reflex Scores:      Patellar  reflexes are 0 on the right side and 2+ on the left side. Unable to assess achilles tendon d/t pt being unable to relax.  Skin: Skin is warm, dry and intact. No lesion and no rash noted.  Psychiatric: He has a normal mood and affect. His speech is normal and behavior is normal. Thought content normal.   BP 124/90 mmHg  Pulse 98  Temp(Src) 98.5 F (36.9 C) (Oral)  Resp 16  Ht  (1.651 m)  Wt 254 lb (115.214 kg)  BMI 42.27 kg/m2  SpO2 98%  Results for orders placed or performed in visit on  03/04/15  POCT UA - Microscopic Only  Result Value Ref Range   WBC, Ur, HPF, POC negative    RBC, urine, microscopic 0-1    Bacteria, U Microscopic negative    Mucus, UA negative    Epithelial cells, urine per micros 0-1    Crystals, Ur, HPF, POC negative    Casts, Ur, LPF, POC negative    Yeast, UA negative   POCT urinalysis dipstick  Result Value Ref Range   Color, UA yellow    Clarity, UA clear    Glucose, UA negative    Bilirubin, UA negative    Ketones, UA negative    Spec Grav, UA <=1.005    Blood, UA trace-lysed    pH, UA 6.0    Protein, UA negative    Urobilinogen, UA 0.2    Nitrite, UA negative    Leukocytes, UA Negative    [facility] reading (PRIMARY) by  Dr. Clelia Croft: no acute abnormality, narrowing of L5, please comment.     Assessment & Plan:  1. Bilateral low back pain with sciatica, sciatica laterality unspecified 2. Asymmetrical deep tendon reflexes Radiograph without acute abnormality. Strength and sensation intact however patellar reflexes are asymmetric. 2+ on left side and absent on the right side. Due to bilateral sciatica and asymmetric reflexes will send for MRI. He will stop Anaprox. Continue Flexeril 3 times a day. Stop Norco. Start prednisone taper. If he develops sudden weakness, paresthesias, problems with bowel or bladder he will go to the ED.  - POCT UA - Microscopic Only - POCT urinalysis dipstick - DG Lumbar Spine Complete; Future - MR Lumbar Spine Wo Contrast; Future - predniSONE (DELTASONE) 20 MG tablet; Take 3 PO QAM x3days, 2 PO QAM x3days, 1 PO QAM x3days  Dispense: 18 tablet; Refill: 0 - cyclobenzaprine (FLEXERIL) 10 MG tablet; Take 1 tablet (10 mg total) by mouth 3 (three) times daily as needed for muscle spasms.  Dispense: 30 tablet; Refill: 0   [place] [employer]. Dyke Brackett, MHS [facility]  03/04/2015

## 2015-03-04 NOTE — Patient Instructions (Addendum)
Start prednisone - take as directed on the bottle. Take norco until finished. Continue flexeril three times a day. STOP anaprox. I will call you with the results of your MRI.  YOU ARE SCHEDULED FOR AN MRI Saturday April 30TH AT 4:45 AT [facility] [place].

## 2015-03-05 ENCOUNTER — Ambulatory Visit (HOSPITAL_COMMUNITY): Payer: Self-pay

## 2015-03-05 NOTE — Progress Notes (Signed)
Reviewed documentation and xray and agree w/ assessment and plan. Eva Shaw, MD MPH   

## 2015-03-08 ENCOUNTER — Ambulatory Visit (HOSPITAL_BASED_OUTPATIENT_CLINIC_OR_DEPARTMENT_OTHER): Payer: PRIVATE HEALTH INSURANCE

## 2015-03-22 ENCOUNTER — Ambulatory Visit (INDEPENDENT_AMBULATORY_CARE_PROVIDER_SITE_OTHER): Payer: PRIVATE HEALTH INSURANCE | Admitting: Family Medicine

## 2015-03-22 VITALS — BP 120/90 | HR 119 | Temp 98.8°F | Ht 65.0 in | Wt 261.5 lb

## 2015-03-22 DIAGNOSIS — M545 Low back pain, unspecified: Secondary | ICD-10-CM

## 2015-03-22 MED ORDER — HYDROCODONE-ACETAMINOPHEN 5-325 MG PO TABS: 1.0000 | ORAL_TABLET | Freq: Four times a day (QID) | ORAL | Status: DC | PRN

## 2015-03-22 MED ORDER — CYCLOBENZAPRINE HCL 5 MG PO TABS
5.0000 mg | ORAL_TABLET | Freq: Two times a day (BID) | ORAL | Status: DC | PRN
Start: 2015-03-22 — End: 2015-05-02

## 2015-03-22 NOTE — Progress Notes (Signed)
° °  Subjective:    Patient ID: Richard Berger, male    DOB: 1976/07/23, 39 y.o.   MRN: [id number] This chart was scribed for Richard SidleKurt Lauenstein, MD by Littie Deedsichard Sun, Medical Scribe. This patient was seen in Room 8 and the patient's care was started at 2:11 PM.   HPI HPI Comments: Richard AlaJessy A Quinlivan is a 39 y.o. male with a hx of obesity and corneal transplant who presents to the [facility] for a follow-up for gradual onset low back pain radiating to both legs that has been bothering him intermittently for 4-5 years but flared up 1 month ago. The pain is worsened when straightening out his legs. He has had physical therapy for his back. He is on his feet a lot for work; he spends 2 hours at a time on his feet, with breaks in between. Patient was seen here previously for the pain on 02/22/15 by Dr. Dareen PianoAnderson and on 03/04/15 by Dr. Clelia CroftShaw. He has an upcoming appointment to have imaging of his back done in 6 days.  Note from last visit 03/04/15 by Dr. Clelia CroftShaw: This is a 39 year old male presenting for follow up low back pain. He was seen here 10 days ago by Dr. Dareen PianoAnderson. Diagnosed with lumbar strain with sciatica. Prescribed norco, flexeril and anaprox. Pt reports his pain has stayed the same - no better, no worse. Taking anaprox BID, flexeril TID and norco BID. Shooting pain down back of leg to level of mid-calf. Shooting pain worse in right than left. Pain in back located to bilateral lower back around waistline. Denies weakness, paresthesias or problems with bowel or bladder. Had similar back pain 5 years ago that resolved after 3 days. Denies dysuria or hematuria.   Patient works for a [employer]. He is originally from [place]. He's on his feet most of the day with breaks every 2 hours. Review of Systems  Musculoskeletal: Positive for back pain.       Objective:   Physical Exam CONSTITUTIONAL: Well developed/well nourished HEAD: Normocephalic/atraumatic EYES: Right esophoria ENMT: Mucous  membranes moist NECK: supple no meningeal signs SPINE: Non-tender back. Normal contour. Normal straight leg raising. CV: S1/S2 noted, no murmurs/rubs/gallops noted LUNGS: Lungs are clear to auscultation bilaterally, no apparent distress ABDOMEN: soft, nontender, no rebound or guarding GU: no cva tenderness NEURO: Pt is awake/alert, moves all extremitiesx4. Normal strength in legs. EXTREMITIES: pulses normal, full ROM SKIN: warm, color normal. No rash. PSYCH: no abnormalities of mood noted      Assessment & Plan:   This chart was scribed in my presence and reviewed by me personally.    ICD-9-CM ICD-10-CM   1. Bilateral low back pain without sciatica 724.2 M54.5 HYDROcodone-acetaminophen (NORCO) 5-325 MG per tablet     cyclobenzaprine (FLEXERIL) 5 MG tablet     Ambulatory referral to Chiropractic     Signed, Richard SidleKurt Lauenstein, MD

## 2015-03-27 ENCOUNTER — Telehealth: Payer: Self-pay | Admitting: Radiology

## 2015-03-27 ENCOUNTER — Ambulatory Visit (HOSPITAL_COMMUNITY): Payer: PRIVATE HEALTH INSURANCE

## 2015-03-28 ENCOUNTER — Ambulatory Visit (HOSPITAL_COMMUNITY): Admission: RE | Admit: 2015-03-28 | Payer: PRIVATE HEALTH INSURANCE | Source: Ambulatory Visit

## 2015-04-25 ENCOUNTER — Telehealth: Payer: Self-pay | Admitting: Physician Assistant

## 2015-04-25 NOTE — Telephone Encounter (Signed)
Peer-to-peer review complete. MRI lumbar certified. Pre-auth #[id number]

## 2015-05-02 ENCOUNTER — Ambulatory Visit (INDEPENDENT_AMBULATORY_CARE_PROVIDER_SITE_OTHER): Payer: PRIVATE HEALTH INSURANCE | Admitting: Emergency Medicine

## 2015-05-02 VITALS — BP 132/82 | HR 111 | Temp 99.2°F | Resp 17 | Ht 66.5 in | Wt 263.0 lb

## 2015-05-02 DIAGNOSIS — M25572 Pain in left ankle and joints of left foot: Secondary | ICD-10-CM | POA: Diagnosis not present

## 2015-05-02 DIAGNOSIS — Z0000 Encounter for general adult medical examination without abnormal findings: Secondary | ICD-10-CM

## 2015-05-02 LAB — POCT URINALYSIS DIPSTICK
Bilirubin, UA: NEGATIVE
Glucose, UA: NEGATIVE
Ketones, UA: NEGATIVE
Leukocytes, UA: NEGATIVE
NITRITE UA: NEGATIVE
PH UA: 6
Protein, UA: NEGATIVE
Urobilinogen, UA: 0.2

## 2015-05-02 MED ORDER — COLCHICINE 0.6 MG PO CAPS: ORAL_CAPSULE | ORAL | Status: DC

## 2015-05-02 MED ORDER — INDOMETHACIN 25 MG PO CAPS: 25.0000 mg | ORAL_CAPSULE | Freq: Three times a day (TID) | ORAL | Status: DC

## 2015-05-02 NOTE — Patient Instructions (Signed)

## 2015-05-02 NOTE — Progress Notes (Signed)
Subjective:  Patient ID: Richard Berger, male    DOB: 04-13-1976  Age: 39 y.o. MRN: [id number]  CC: Employment Physical   HPI [name] presents  for a physical examination. He said that he has had an outbreak of gout over the last several days in his left great toe. Is no acute medical issues.  History Tavarius has a past medical history of Gout.   He has past surgical history that includes Eye surgery (Right).   His  family history is not on file.  He   reports that he has been smoking Cigarettes.  He has a 2.4 pack-year smoking history. He has never used smokeless tobacco. He reports that he drinks about 2.4 oz of alcohol per week. He reports that he does not use illicit drugs.  Outpatient Prescriptions Prior to Visit  Medication Sig Dispense Refill  . cyclobenzaprine (FLEXERIL) 5 MG tablet Take 1 tablet (5 mg total) by mouth 2 (two) times daily as needed for muscle spasms. 30 tablet 0  . HYDROcodone-acetaminophen (NORCO) 5-325 MG per tablet Take 1-2 tablets by mouth every 6 (six) hours as needed. 30 tablet 0  . indomethacin (INDOCIN) 25 MG capsule Take 1 capsule (25 mg total) by mouth 3 (three) times daily with meals. 60 capsule 1   No facility-administered medications prior to visit.    History   Social History  . Marital Status: Single    Spouse Name: n/a  . Number of Children: 1  . Years of Education: 8   Occupational History  . Investment banker, operational   Social History Main Topics  . Smoking status: Current Some Day Smoker -- 0.40 packs/day for 6 years    Types: Cigarettes  . Smokeless tobacco: Never Used  . Alcohol Use: 2.4 oz/week    2 Glasses of wine, 2 Cans of beer per week  . Drug Use: No  . Sexual Activity: No   Other Topics Concern  . None   Social History Narrative   Marital status: single; dating. Moved from [place] to [place] in 2001 with his father.  Family (1 brother and 1 sister) in [place]. Mother died there 01-23-13.   Children: one son in [place].      Employment: employed      Tobacco: 1-2 cigarettes a week.      Alcohol: alcohol on weekends.          Review of Systems  Constitutional: Negative for fever, chills and appetite change.  HENT: Negative for congestion, ear pain, postnasal drip, sinus pressure and sore throat.   Eyes: Negative for pain and redness.  Respiratory: Negative for cough, shortness of breath and wheezing.   Cardiovascular: Negative for leg swelling.  Gastrointestinal: Negative for nausea, vomiting, abdominal pain, diarrhea, constipation and blood in stool.  Endocrine: Negative for polyuria.  Genitourinary: Negative for dysuria, urgency, frequency and flank pain.  Musculoskeletal: Positive for joint swelling and arthralgias. Negative for gait problem.  Skin: Negative for rash.  Neurological: Negative for weakness and headaches.  Psychiatric/Behavioral: Negative for confusion and decreased concentration. The patient is not nervous/anxious.     Objective:  BP 132/82 mmHg  Pulse 111  Temp(Src) 99.2 F (37.3 C) (Oral)  Resp 17  Ht 5' 6.5" (1.689 m)  Wt 263 lb (119.296 kg)  BMI 41.82 kg/m2  SpO2 97%  Physical Exam  Constitutional: He is oriented to person, place, and time. He appears well-developed and well-nourished. No distress.  HENT:  Head: Normocephalic and atraumatic.  Right Ear: External ear normal.  Left Ear: External ear normal.  Nose: Nose normal.  Eyes: Conjunctivae and EOM are normal. Pupils are equal, round, and reactive to light. No scleral icterus.  Neck: Normal range of motion. Neck supple. No tracheal deviation present.  Cardiovascular: Normal rate, regular rhythm and normal heart sounds.   Pulmonary/Chest: Effort normal. No respiratory distress. He has no wheezes. He has no rales.  Abdominal: He exhibits no mass. There is no tenderness. There is no rebound and no guarding.  Musculoskeletal: He exhibits no edema.  Lymphadenopathy:    He has no  cervical adenopathy.  Neurological: He is alert and oriented to person, place, and time. Coordination normal.  Skin: Skin is warm and dry. No rash noted.  Psychiatric: He has a normal mood and affect. His behavior is normal.      Assessment & Plan:   Dmitri was seen today for employment physical.  Diagnoses and all orders for this visit:  Annual physical exam Orders: -     Cancel: CBC with Differential/Platelet -     Cancel: Comprehensive metabolic panel -     POCT UA - Microscopic Only -     POCT urinalysis dipstick -     Cancel: Lipid panel -     Cancel: TSH -     Cancel: Vit D  25 hydroxy (rtn osteoporosis monitoring) -     CBC with Differential/Platelet; Future -     Comprehensive metabolic panel; Future -     Lipid panel; Future -     TSH; Future -     Vit D  25 hydroxy (rtn osteoporosis monitoring); Future  Morbid obesity Orders: -     Cancel: CBC with Differential/Platelet -     Cancel: Comprehensive metabolic panel -     POCT UA - Microscopic Only -     POCT urinalysis dipstick -     Cancel: Lipid panel -     Cancel: TSH -     Cancel: Vit D  25 hydroxy (rtn osteoporosis monitoring) -     CBC with Differential/Platelet; Future -     Comprehensive metabolic panel; Future -     Lipid panel; Future -     TSH; Future -     Vit D  25 hydroxy (rtn osteoporosis monitoring); Future  Pain in joint, ankle and foot, left Orders: -     Cancel: CBC with Differential/Platelet -     Cancel: Comprehensive metabolic panel -     POCT UA - Microscopic Only -     POCT urinalysis dipstick -     Cancel: Lipid panel -     Cancel: TSH -     Cancel: Vit D  25 hydroxy (rtn osteoporosis monitoring) -     CBC with Differential/Platelet; Future -     Comprehensive metabolic panel; Future -     Lipid panel; Future -     TSH; Future -     Vit D  25 hydroxy (rtn osteoporosis monitoring); Future  Other orders -     indomethacin (INDOCIN) 25 MG capsule; Take 1 capsule (25 mg total) by  mouth 3 (three) times daily with meals. -     Colchicine 0.6 MG CAPS; 2 now and one in one hour.  One daily until resolved   I have discontinued Mr. Davisson indomethacin, HYDROcodone-acetaminophen, and cyclobenzaprine. I am also having him start on indomethacin and Colchicine.  Meds ordered this  encounter  Medications  . indomethacin (INDOCIN) 25 MG capsule    Sig: Take 1 capsule (25 mg total) by mouth 3 (three) times daily with meals.    Dispense:  90 capsule    Refill:  1  . Colchicine 0.6 MG CAPS    Sig: 2 now and one in one hour.  One daily until resolved    Dispense:  30 capsule    Refill:  0    Appropriate red flag conditions were discussed with the patient as well as actions that should be taken.  Patient expressed his understanding.  Follow-up: Return if symptoms worsen or fail to improve.  Carmelina Dane, MD

## 2015-05-03 ENCOUNTER — Other Ambulatory Visit (INDEPENDENT_AMBULATORY_CARE_PROVIDER_SITE_OTHER): Payer: PRIVATE HEALTH INSURANCE | Admitting: Radiology

## 2015-05-03 DIAGNOSIS — Z0000 Encounter for general adult medical examination without abnormal findings: Secondary | ICD-10-CM

## 2015-05-03 DIAGNOSIS — M25572 Pain in left ankle and joints of left foot: Secondary | ICD-10-CM

## 2015-05-03 LAB — CBC WITH DIFFERENTIAL/PLATELET
BASOS ABS: 0 10*3/uL (ref 0.0–0.1)
BASOS PCT: 0 % (ref 0–1)
EOS ABS: 0.2 10*3/uL (ref 0.0–0.7)
EOS PCT: 2 % (ref 0–5)
HCT: 44.5 % (ref 39.0–52.0)
Hemoglobin: 15.6 g/dL (ref 13.0–17.0)
LYMPHS ABS: 3.6 10*3/uL (ref 0.7–4.0)
LYMPHS PCT: 45 % (ref 12–46)
MCH: 29.7 pg (ref 26.0–34.0)
MCHC: 35.1 g/dL (ref 30.0–36.0)
MCV: 84.8 fL (ref 78.0–100.0)
MPV: 9.1 fL (ref 8.6–12.4)
Monocytes Absolute: 0.9 10*3/uL (ref 0.1–1.0)
Monocytes Relative: 11 % (ref 3–12)
NEUTROS ABS: 3.3 10*3/uL (ref 1.7–7.7)
NEUTROS PCT: 42 % — AB (ref 43–77)
PLATELETS: 320 10*3/uL (ref 150–400)
RBC: 5.25 MIL/uL (ref 4.22–5.81)
RDW: 14 % (ref 11.5–15.5)
WBC: 7.9 10*3/uL (ref 4.0–10.5)

## 2015-05-03 LAB — LIPID PANEL
CHOLESTEROL: 153 mg/dL (ref 0–200)
HDL: 39 mg/dL — ABNORMAL LOW (ref >=40)
LDL Cholesterol: 83 mg/dL (ref 0–99)
TRIGLYCERIDES: 153 mg/dL — AB (ref <150)
Total CHOL/HDL Ratio: 3.9 Ratio
VLDL: 31 mg/dL (ref 0–40)

## 2015-05-03 LAB — COMPREHENSIVE METABOLIC PANEL
ALT: 31 U/L (ref 0–53)
AST: 22 U/L (ref 0–37)
Albumin: 4.1 g/dL (ref 3.5–5.2)
Alkaline Phosphatase: 57 U/L (ref 39–117)
BILIRUBIN TOTAL: 0.4 mg/dL (ref 0.2–1.2)
BUN: 13 mg/dL (ref 6–23)
CALCIUM: 9.2 mg/dL (ref 8.4–10.5)
CO2: 28 mEq/L (ref 19–32)
Chloride: 104 mEq/L (ref 96–112)
Creat: 1.07 mg/dL (ref 0.50–1.35)
GLUCOSE: 101 mg/dL — AB (ref 70–99)
Potassium: 4.5 mEq/L (ref 3.5–5.3)
Sodium: 139 mEq/L (ref 135–145)
Total Protein: 7 g/dL (ref 6.0–8.3)

## 2015-05-03 LAB — TSH: TSH: 1.427 u[IU]/mL (ref 0.350–4.500)

## 2015-05-03 NOTE — Progress Notes (Signed)
Pt here for labs only. 

## 2015-05-05 ENCOUNTER — Other Ambulatory Visit: Payer: Self-pay | Admitting: Emergency Medicine

## 2015-05-05 LAB — VITAMIN D 25 HYDROXY (VIT D DEFICIENCY, FRACTURES): Vit D, 25-Hydroxy: 15 ng/mL — ABNORMAL LOW (ref 30–100)

## 2015-05-05 MED ORDER — VITAMIN D (ERGOCALCIFEROL) 1.25 MG (50000 UNIT) PO CAPS: 50000.0000 [IU] | ORAL_CAPSULE | ORAL | Status: DC

## 2015-05-22 ENCOUNTER — Other Ambulatory Visit: Payer: Self-pay | Admitting: Physician Assistant

## 2015-10-19 ENCOUNTER — Ambulatory Visit (INDEPENDENT_AMBULATORY_CARE_PROVIDER_SITE_OTHER): Payer: PRIVATE HEALTH INSURANCE | Admitting: Physician Assistant

## 2015-10-19 VITALS — BP 118/84 | HR 103 | Temp 98.0°F | Resp 18 | Ht 66.5 in | Wt 259.6 lb

## 2015-10-19 DIAGNOSIS — M10071 Idiopathic gout, right ankle and foot: Secondary | ICD-10-CM | POA: Diagnosis not present

## 2015-10-19 DIAGNOSIS — M109 Gout, unspecified: Secondary | ICD-10-CM

## 2015-10-19 MED ORDER — INDOMETHACIN 25 MG PO CAPS: 25.0000 mg | ORAL_CAPSULE | Freq: Three times a day (TID) | ORAL | Status: DC

## 2015-10-19 MED ORDER — COLCHICINE 0.6 MG PO CAPS: ORAL_CAPSULE | ORAL | Status: DC

## 2015-10-19 NOTE — Progress Notes (Signed)
   10/19/2015 1:58 PM   DOB: 23-Aug-1976 / MRN: [id number]  SUBJECTIVE:  Richard Berger is a 39 y.o. male presenting for a gout flare. Reports this started yesterday and he complains of generalized pain in the right foot.  He has had this before and he took colchicine and indomethacin with good relief.  He has a long history of this and CHL reveals several elevated uric acid levels.  He has No Known Allergies.   He  has a past medical history of Gout.    He  reports that he has been smoking Cigarettes.  He has a 2.4 pack-year smoking history. He has never used smokeless tobacco. He reports that he drinks about 2.4 oz of alcohol per week. He reports that he does not use illicit drugs. He  reports that he does not engage in sexual activity. The patient  has past surgical history that includes Eye surgery (Right).  His family history is not on file.  Review of Systems  Constitutional: Negative for fever.  Respiratory: Negative for shortness of breath.   Cardiovascular: Negative for chest pain.  Musculoskeletal: Positive for joint pain. Negative for falls.  Neurological: Negative for dizziness and headaches.    Problem list and medications reviewed and updated by myself where necessary, and exist elsewhere in the encounter.   OBJECTIVE:  BP 118/84 mmHg  Pulse 103  Temp(Src) 98 F (36.7 C) (Oral)  Resp 18  Ht 5' 6.5" (1.689 m)  Wt 259 lb 9.6 oz (117.754 kg)  BMI 41.28 kg/m2  SpO2 99% CrCl cannot be calculated (Patient has no serum creatinine result on file.).  Physical Exam  Constitutional: He is oriented to person, place, and time. He appears well-developed. He does not appear ill.  Eyes: Conjunctivae and EOM are normal. Pupils are equal, round, and reactive to light.  Cardiovascular: Normal rate.   Pulmonary/Chest: Effort normal.  Abdominal: He exhibits no distension.  Musculoskeletal: Normal range of motion.       Right lower leg: Normal. He exhibits no tenderness.        Feet:  Neurological: He is alert and oriented to person, place, and time. No cranial nerve deficit. Coordination normal.  Skin: Skin is warm and dry. He is not diaphoretic.  Psychiatric: He has a normal mood and affect.  Nursing note and vitals reviewed.    Uric Acid, Serum 4.0 - 7.8 mg/dL 8.4 (H) 9.0 (H) 9.4 (H)        No results found for this or any previous visit (from the past 48 hour(s)).  ASSESSMENT AND PLAN  Richard Berger was seen today for gout.  Diagnoses and all orders for this visit:  Acute gout of right foot, unspecified cause: Patient was out of his normal gout medication.  Will provide him with refills.  He is not interested in a prophylactic measure.   -     Colchicine 0.6 MG CAPS; 2 now and one in one hour.  One daily until resolved -     indomethacin (INDOCIN) 25 MG capsule; Take 1 capsule (25 mg total) by mouth 3 (three) times daily with meals.    The patient was advised to call or return to clinic if he does not see an improvement in symptoms or to seek the care of the closest emergency department if he worsens with the above plan.   Deliah BostonMichael Clark, MHS, PA-C [facility] 10/19/2015 1:58 PM

## 2015-10-31 ENCOUNTER — Ambulatory Visit (INDEPENDENT_AMBULATORY_CARE_PROVIDER_SITE_OTHER): Payer: PRIVATE HEALTH INSURANCE | Admitting: Physician Assistant

## 2015-10-31 VITALS — BP 136/82 | HR 101 | Temp 98.3°F | Resp 18 | Ht 65.5 in | Wt 259.4 lb

## 2015-10-31 DIAGNOSIS — R0981 Nasal congestion: Secondary | ICD-10-CM

## 2015-10-31 DIAGNOSIS — B9789 Other viral agents as the cause of diseases classified elsewhere: Principal | ICD-10-CM

## 2015-10-31 DIAGNOSIS — J069 Acute upper respiratory infection, unspecified: Secondary | ICD-10-CM | POA: Diagnosis not present

## 2015-10-31 MED ORDER — HYDROCODONE-HOMATROPINE 5-1.5 MG/5ML PO SYRP: 5.0000 mL | ORAL_SOLUTION | Freq: Three times a day (TID) | ORAL | Status: DC | PRN

## 2015-10-31 MED ORDER — BENZONATATE 100 MG PO CAPS: 100.0000 mg | ORAL_CAPSULE | Freq: Three times a day (TID) | ORAL | Status: DC | PRN

## 2015-10-31 NOTE — Progress Notes (Signed)
   Subjective:    Patient ID: Richard Berger, male    DOB: November 30, 1975, 39 y.o.   MRN: [id number]  Chief Complaint  Patient presents with  . Cough    Non productive, Chest pain when coughing on left side  . Nasal Congestion   Medications, allergies, past medical history, surgical history, family history, social history and problem list reviewed and updated.  HPI  [age] yom presents with 3 day h/o above symptoms.  Started with sneezing and non prod cough. Head congestion. Symptoms have been persistent. Nyquil not helping. Bilateral lower rib pain past day with coughing spells. No cp otherwise. No exertional cp. Denies sick contacts. Denies fevers, chills. No abd pain.   Review of Systems No n/v, diarrhea.     Objective:   Physical Exam  Constitutional: He appears well-developed and well-nourished.  Non-toxic appearance. He does not have a sickly appearance. He does not appear ill. No distress.  BP 136/82 mmHg  Pulse 101  Temp(Src) 98.3 F (36.8 C) (Oral)  Resp 18  Ht 5' 5.5" (1.664 m)  Wt 259 lb 6.4 oz (117.663 kg)  BMI 42.49 kg/m2  SpO2 98%  HR recheck 92 bpm.    HENT:  Right Ear: Tympanic membrane normal.  Left Ear: Tympanic membrane normal.  Nose: Mucosal edema and rhinorrhea present. Right sinus exhibits no maxillary sinus tenderness and no frontal sinus tenderness. Left sinus exhibits no maxillary sinus tenderness and no frontal sinus tenderness.  Mouth/Throat: Uvula is midline, oropharynx is clear and moist and mucous membranes are normal.  Pulmonary/Chest: Effort normal and breath sounds normal. No tachypnea.  Lymphadenopathy:       Head (right side): No submental, no submandibular and no tonsillar adenopathy present.       Head (left side): Submandibular adenopathy present. No submental and no tonsillar adenopathy present.    He has no cervical adenopathy.      Assessment & Plan:   Viral URI with cough - Plan: benzonatate (TESSALON) 100 MG capsule,  HYDROcodone-homatropine (HYCODAN) 5-1.5 MG/5ML syrup  Head congestion --benign exam, doubt bacterial etiology --tessalon, hycodan for cough --recommend mucinex-d --contact office if not improved 4 days --chest pain not concerning as only assoc with coughing spells and resolves after cough spells resolved, no exertional cp  Donnajean Lopesodd M. Sani Madariaga, PA-C Physician Assistant-Certified [facility]  10/31/2015 9:08 AM

## 2015-10-31 NOTE — Patient Instructions (Signed)
Please take the tessalon cough medicine during the day as prescribed. Please take the tessalon cough medicine at night once nightly to help with the cough at night time. Taking mucinex-d 1-2 times daily will help with the congestion. If you're not improved in 4 days please let us know and we may consider an antibiotic at that time.   Upper Respiratory Infection, Adult Most upper respiratory infections (URIs) are a viral infection of the air passages leading to the lungs. A URI affects the nose, throat, and upper air passages. The most common type of URI is nasopharyngitis and is typically referred to as "the common cold." URIs run their course and usually go away on their own. Most of the time, a URI does not require medical attention, but sometimes a bacterial infection in the upper airways can follow a viral infection. This is called a secondary infection. Sinus and middle ear infections are common types of secondary upper respiratory infections. Bacterial pneumonia can also complicate a URI. A URI can worsen asthma and chronic obstructive pulmonary disease (COPD). Sometimes, these complications can require emergency medical care and may be life threatening.  CAUSES Almost all URIs are caused by viruses. A virus is a type of germ and can spread from one person to another.  RISKS FACTORS You may be at risk for a URI if:   You smoke.   You have chronic heart or lung disease.  You have a weakened defense (immune) system.   You are very young or very old.   You have nasal allergies or asthma.  You work in crowded or poorly ventilated areas.  You work in health care facilities or schools. SIGNS AND SYMPTOMS  Symptoms typically develop 2-3 days after you come in contact with a cold virus. Most viral URIs last 7-10 days. However, viral URIs from the influenza virus (flu virus) can last 14-18 days and are typically more severe. Symptoms may include:   Runny or stuffy (congested) nose.    Sneezing.   Cough.   Sore throat.   Headache.   Fatigue.   Fever.   Loss of appetite.   Pain in your forehead, behind your eyes, and over your cheekbones (sinus pain).  Muscle aches.  DIAGNOSIS  Your health care provider may diagnose a URI by:  Physical exam.  Tests to check that your symptoms are not due to another condition such as:  Strep throat.  Sinusitis.  Pneumonia.  Asthma. TREATMENT  A URI goes away on its own with time. It cannot be cured with medicines, but medicines may be prescribed or recommended to relieve symptoms. Medicines may help:  Reduce your fever.  Reduce your cough.  Relieve nasal congestion. HOME CARE INSTRUCTIONS   Take medicines only as directed by your health care provider.   Gargle warm saltwater or take cough drops to comfort your throat as directed by your health care provider.  Use a warm mist humidifier or inhale steam from a shower to increase air moisture. This may make it easier to breathe.  Drink enough fluid to keep your urine clear or pale yellow.   Eat soups and other clear broths and maintain good nutrition.   Rest as needed.   Return to work when your temperature has returned to normal or as your health care provider advises. You may need to stay home longer to avoid infecting others. You can also use a face mask and careful hand washing to prevent spread of the virus.  Increase the  usage of your inhaler if you have asthma.   Do not use any tobacco products, including cigarettes, chewing tobacco, or electronic cigarettes. If you need help quitting, ask your health care provider. PREVENTION  The best way to protect yourself from getting a cold is to practice good hygiene.   Avoid oral or hand contact with people with cold symptoms.   Wash your hands often if contact occurs.  There is no clear evidence that vitamin C, vitamin E, echinacea, or exercise reduces the chance of developing a cold.  However, it is always recommended to get plenty of rest, exercise, and practice good nutrition.  SEEK MEDICAL CARE IF:   You are getting worse rather than better.   Your symptoms are not controlled by medicine.   You have chills.  You have worsening shortness of breath.  You have brown or red mucus.  You have yellow or brown nasal discharge.  You have pain in your face, especially when you bend forward.  You have a fever.  You have swollen neck glands.  You have pain while swallowing.  You have white areas in the back of your throat. SEEK IMMEDIATE MEDICAL CARE IF:   You have severe or persistent:  Headache.  Ear pain.  Sinus pain.  Chest pain.  You have chronic lung disease and any of the following:  Wheezing.  Prolonged cough.  Coughing up blood.  A change in your usual mucus.  You have a stiff neck.  You have changes in your:  Vision.  Hearing.  Thinking.  Mood. MAKE SURE YOU:   Understand these instructions.  Will watch your condition.  Will get help right away if you are not doing well or get worse.   This information is not intended to replace advice given to you by your health care provider. Make sure you discuss any questions you have with your health care provider.   Document Released: 04/20/2001 Document Revised: 03/11/2015 Document Reviewed: 01/30/2014 Elsevier Interactive Patient Education [employer].

## 2015-11-02 ENCOUNTER — Telehealth: Payer: Self-pay | Admitting: Family Medicine

## 2015-11-02 DIAGNOSIS — R059 Cough, unspecified: Secondary | ICD-10-CM

## 2015-11-02 DIAGNOSIS — R05 Cough: Secondary | ICD-10-CM

## 2015-11-02 MED ORDER — AZITHROMYCIN 250 MG PO TABS
ORAL_TABLET | ORAL | Status: DC
Start: 2015-11-02 — End: 2016-03-07

## 2015-11-02 NOTE — Telephone Encounter (Signed)
Pt called because he is still feeling sick.  He does not have a fever but states that he feels bad and is coughing.  He is somewhat difficult to understand. He is under the impression that if not better in 2 days since his visit for a cold (which was 2 days ago) that he should call and receive an abx.   Will rx zack. Asked to let us know if not better in a few days

## 2016-03-07 ENCOUNTER — Ambulatory Visit (INDEPENDENT_AMBULATORY_CARE_PROVIDER_SITE_OTHER): Payer: PRIVATE HEALTH INSURANCE | Admitting: Urgent Care

## 2016-03-07 ENCOUNTER — Ambulatory Visit: Payer: PRIVATE HEALTH INSURANCE

## 2016-03-07 VITALS — BP 102/70 | HR 83 | Temp 97.5°F | Resp 16 | Ht 65.5 in | Wt 247.0 lb

## 2016-03-07 DIAGNOSIS — R059 Cough, unspecified: Secondary | ICD-10-CM

## 2016-03-07 DIAGNOSIS — R05 Cough: Secondary | ICD-10-CM

## 2016-03-07 DIAGNOSIS — R0602 Shortness of breath: Secondary | ICD-10-CM | POA: Diagnosis not present

## 2016-03-07 MED ORDER — AZITHROMYCIN 250 MG PO TABS: ORAL_TABLET | ORAL | Status: DC

## 2016-03-07 MED ORDER — CETIRIZINE HCL 10 MG PO TABS: 10.0000 mg | ORAL_TABLET | Freq: Every day | ORAL | Status: DC

## 2016-03-07 MED ORDER — BENZONATATE 100 MG PO CAPS: 100.0000 mg | ORAL_CAPSULE | Freq: Three times a day (TID) | ORAL | Status: DC | PRN

## 2016-03-07 MED ORDER — HYDROCODONE-HOMATROPINE 5-1.5 MG/5ML PO SYRP: 5.0000 mL | ORAL_SOLUTION | Freq: Every evening | ORAL | Status: DC | PRN

## 2016-03-07 NOTE — Patient Instructions (Addendum)
Cough, Adult Coughing is a reflex that clears your throat and your airways. Coughing helps to heal and protect your lungs. It is normal to cough occasionally, but a cough that happens with other symptoms or lasts a long time may be a sign of a condition that needs treatment. A cough may last only 2-3 weeks (acute), or it may last longer than 8 weeks (chronic). CAUSES Coughing is commonly caused by:  Breathing in substances that irritate your lungs.  A viral or bacterial respiratory infection.  Allergies.  Asthma.  Postnasal drip.  Smoking.  Acid backing up from the stomach into the esophagus (gastroesophageal reflux).  Certain medicines.  Chronic lung problems, including COPD (or rarely, lung cancer).  Other medical conditions such as heart failure. HOME CARE INSTRUCTIONS  Pay attention to any changes in your symptoms. Take these actions to help with your discomfort:  Take medicines only as told by your health care provider.  If you were prescribed an antibiotic medicine, take it as told by your health care provider. Do not stop taking the antibiotic even if you start to feel better.  Talk with your health care provider before you take a cough suppressant medicine.  Drink enough fluid to keep your urine clear or pale yellow.  If the air is dry, use a cold steam vaporizer or humidifier in your bedroom or your home to help loosen secretions.  Avoid anything that causes you to cough at work or at home.  If your cough is worse at night, try sleeping in a semi-upright position.  Avoid cigarette smoke. If you smoke, quit smoking. If you need help quitting, ask your health care provider.  Avoid caffeine.  Avoid alcohol.  Rest as needed. SEEK MEDICAL CARE IF:   You have new symptoms.  You cough up pus.  Your cough does not get better after 2-3 weeks, or your cough gets worse.  You cannot control your cough with suppressant medicines and you are losing sleep.  You  develop pain that is getting worse or pain that is not controlled with pain medicines.  You have a fever.  You have unexplained weight loss.  You have night sweats. SEEK IMMEDIATE MEDICAL CARE IF:  You cough up blood.  You have difficulty breathing.  Your heartbeat is very fast.   This information is not intended to replace advice given to you by your health care provider. Make sure you discuss any questions you have with your health care provider.   Document Released: 04/23/2011 Document Revised: 07/16/2015 Document Reviewed: 01/01/2015 Elsevier Interactive Patient Education 2016 Elsevier Inc.     IF you received an x-ray today, you will receive an invoice from [facility]. Please contact  Radiology at [phone] with questions or concerns regarding your invoice.   IF you received labwork today, you will receive an invoice from [facility]. Please contact [facility] at [phone] with questions or concerns regarding your invoice.   Our billing staff will not be able to assist you with questions regarding bills from these companies.  You will be contacted with the lab results as soon as they are available. The fastest way to get your results is to activate your My Chart account. Instructions are located on the last page of this paperwork. If you have not heard from us regarding the results in 2 weeks, please contact this office.      

## 2016-03-07 NOTE — Progress Notes (Signed)
    MRN: [id number] DOB: 1975/11/20  Subjective:   Richard Berger is a 40 y.o. male presenting for chief complaint of Cough  Reports 1 week history of worsening dry cough. His cough elicits shob and wheezing, is worse at night. Also has some stuffy ears, subjective fever. Has tried NyQuil and Robitussin with minimal relief. Denies chest pain, sinus pain, ear pain, throat pain, n/v, abdominal pain. Smokes 3 cigarettes per day. Denies history of allergies, asthma.   Richard Berger currently has no medications in their medication list. Also has No Known Allergies.  Richard Berger  has a past medical history of Gout. Also  has past surgical history that includes Eye surgery (Right).  Objective:   Vitals: BP 102/70 mmHg  Pulse 83  Temp(Src) 97.5 F (36.4 C) (Oral)  Resp 16  Ht 5' 5.5" (1.664 m)  Wt 247 lb (112.038 kg)  BMI 40.46 kg/m2  SpO2 98%  Physical Exam  Constitutional: He is oriented to person, place, and time. He appears well-developed and well-nourished.  HENT:  TM's flat bilaterally, no effusions or erythema. Nasal turbinates pink and moist without sinus tenderness. Postnasal drip present, without oropharyngeal exudates, erythema or abscesses.  Eyes: Right eye exhibits no discharge. Left eye exhibits no discharge. No scleral icterus.  Neck: Normal range of motion. Neck supple.  Cardiovascular: Normal rate, regular rhythm and intact distal pulses.  Exam reveals no gallop and no friction rub.   No murmur heard. Pulmonary/Chest: No respiratory distress. He has no wheezes. He has rales (bibasilar).  Lymphadenopathy:    He has no cervical adenopathy.  Neurological: He is alert and oriented to person, place, and time.  Skin: Skin is warm and dry.   Assessment and Plan :   1. Cough 2. Shortness of breath - Patient refused x-ray due to the time it was taking to perform the x-ray. I counseled him on differential. Patient is to start with conservative management and start antibiotic course in  3-4 days if no improvement. Return to clinic if her not better in 1 week, consider x-ray at that point.  Wallis BambergMario Montrey Buist, PA-C [facility] [phone] 03/07/2016 10:03 AM

## 2016-04-08 ENCOUNTER — Ambulatory Visit (INDEPENDENT_AMBULATORY_CARE_PROVIDER_SITE_OTHER): Payer: PRIVATE HEALTH INSURANCE | Admitting: Physician Assistant

## 2016-04-08 VITALS — BP 132/80 | HR 85 | Temp 97.5°F | Resp 16 | Ht 65.0 in | Wt 245.0 lb

## 2016-04-08 DIAGNOSIS — R339 Retention of urine, unspecified: Secondary | ICD-10-CM

## 2016-04-08 DIAGNOSIS — K5900 Constipation, unspecified: Secondary | ICD-10-CM

## 2016-04-08 DIAGNOSIS — R300 Dysuria: Secondary | ICD-10-CM

## 2016-04-08 LAB — POCT URINALYSIS DIP (MANUAL ENTRY)
BILIRUBIN UA: NEGATIVE
BILIRUBIN UA: NEGATIVE
GLUCOSE UA: NEGATIVE
NITRITE UA: NEGATIVE
Spec Grav, UA: <=1.005
Urobilinogen, UA: 0.2
pH, UA: 5.5

## 2016-04-08 LAB — POC MICROSCOPIC URINALYSIS (UMFC): MUCUS RE: ABSENT

## 2016-04-08 LAB — POCT CBC
GRANULOCYTE PERCENT: 67.5 % (ref 37–80)
HEMATOCRIT: 46.8 % (ref 43.5–53.7)
HEMOGLOBIN: 16.1 g/dL (ref 14.1–18.1)
LYMPH, POC: 2.5 (ref 0.6–3.4)
MCH, POC: 29.5 pg (ref 27–31.2)
MCHC: 34.4 g/dL (ref 31.8–35.4)
MCV: 85.8 fL (ref 80–97)
MID (cbc): 0.4 (ref 0–0.9)
MPV: 7.3 fL (ref 0–99.8)
POC GRANULOCYTE: 6 (ref 2–6.9)
POC LYMPH PERCENT: 28.2 %L (ref 10–50)
POC MID %: 4.3 %M (ref 0–12)
Platelet Count, POC: 254 10*3/uL (ref 142–424)
RBC: 5.45 M/uL (ref 4.69–6.13)
RDW, POC: 13.7 %
WBC: 8.9 10*3/uL (ref 4.6–10.2)

## 2016-04-08 LAB — GLUCOSE, POCT (MANUAL RESULT ENTRY): POC Glucose: 89 mg/dl (ref 70–99)

## 2016-04-08 LAB — TSH: TSH: 0.84 m[IU]/L (ref 0.40–4.50)

## 2016-04-08 MED ORDER — POLYETHYLENE GLYCOL 3350 17 GM/SCOOP PO POWD: 17.0000 g | Freq: Two times a day (BID) | ORAL | Status: DC | PRN

## 2016-04-08 MED ORDER — SULFAMETHOXAZOLE-TRIMETHOPRIM 800-160 MG PO TABS: 1.0000 | ORAL_TABLET | Freq: Two times a day (BID) | ORAL | Status: DC

## 2016-04-08 NOTE — Progress Notes (Signed)
[facility] [address], [place] [address] [phone]  Date:  04/08/2016   Name:  Richard Berger   DOB:  December 11, 1975   MRN:  [id number]  PCP:  Richard DueGUEST, CHRIS WARREN, MD    History of Present Illness:  Richard Berger is a 40 y.o. male patient who [facility] cc of urinary retention. This started this week with urinary pain, and blood.  He has had no penile discharge.  He has some dysuria.  Stream feels weakened.  Lower abdominal pain.  No swelling.  Hx of constipation.  He has not had a bowel movement for 1.5 weeks.  No blood in his stool.  No incontinence.  He has has had no fever.  No back pain.  There is no nausea.  No hx of kidney stones. He is has taken the hycodan just prior to his symptoms starting, for a lingering cough.  Sexually active 1.5 months ago unprotected.    Patient Active Problem List   Diagnosis Date Noted  . Morbid obesity (HCC) 05/02/2015  . Obesity 01/17/2015  . Hyperuricemia 06/27/2014  . 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 Gout 06/09/2012    Past Medical History  Diagnosis Date  . Gout     Past Surgical History  Procedure Laterality Date  . Eye surgery Right     Cataract    Social History  Substance Use Topics  . Smoking status: Current Some Day Smoker -- 0.40 packs/day for 6 years    Types: Cigarettes  . Smokeless tobacco: Never Used  . Alcohol Use: 2.4 oz/week    2 Glasses of wine, 2 Cans of beer per week    History reviewed. No pertinent family history.  No Known Allergies  Medication list has been reviewed and updated.  No current outpatient prescriptions on file prior to visit.   No current facility-administered medications on file prior to visit.    ROS ROS otherwise unremarkable unless listed above.   Physical Examination: BP 132/80 mmHg  Pulse 85  Temp(Src) 97.5 F (36.4 C) (Oral)  Resp 16  Ht 5\' 5"  (1.651 m)  Wt 245 lb (111.131 kg)  BMI 40.77 kg/m2  SpO2 99% Ideal Body Weight: Weight  in (lb) to have BMI = 25: 149.9  Physical Exam  Constitutional: He is oriented to person, place, and time. He appears well-developed and well-nourished. No distress.  HENT:  Head: Normocephalic and atraumatic.  Eyes: Conjunctivae and EOM are normal. Pupils are equal, round, and reactive to light.  Cardiovascular: Normal rate.   Pulmonary/Chest: Effort normal. No respiratory distress.  Abdominal: Soft. Normal appearance and bowel sounds are normal. There is no hepatosplenomegaly. There is tenderness in the suprapubic area. There is no CVA tenderness, no tenderness at McBurney's point and negative Murphy's sign. No hernia.  Neurological: He is alert and oriented to person, place, and time.  Skin: Skin is warm and dry. He is not diaphoretic.  Psychiatric: He has a normal mood and affect. His behavior is normal.   Results for orders placed or performed in visit on 04/08/16  Urine culture  Result Value Ref Range   Colony Count NO GROWTH    Organism ID, Bacteria NO GROWTH   TSH  Result Value Ref Range   TSH 0.84 0.40 - 4.50 mIU/L  PSA  Result Value Ref Range   PSA 4.13 (H) <=4.00 ng/mL  POCT Microscopic Urinalysis ([facility])  Result Value Ref Range   WBC,UR,HPF,POC Too  numerous to count  (A) None WBC/hpf   RBC,UR,HPF,POC Few (A) None RBC/hpf   Bacteria Few (A) None, Too numerous to count   Mucus Absent Absent   Epithelial Cells, UR Per Microscopy None None, Too numerous to count cells/hpf  POCT urinalysis dipstick  Result Value Ref Range   Color, UA yellow yellow   Clarity, UA clear clear   Glucose, UA negative negative   Bilirubin, UA negative negative   Ketones, POC UA negative negative   Spec Grav, UA <=1.005    Blood, UA large (A) negative   pH, UA 5.5    Protein Ur, POC trace (A) negative   Urobilinogen, UA 0.2    Nitrite, UA Negative Negative   Leukocytes, UA moderate (2+) (A) Negative  POCT CBC  Result Value Ref Range   WBC 8.9 4.6 - 10.2 K/uL   Lymph, poc 2.5 0.6 - 3.4    POC LYMPH PERCENT 28.2 10 - 50 %L   MID (cbc) 0.4 0 - 0.9   POC MID % 4.3 0 - 12 %M   POC Granulocyte 6.0 2 - 6.9   Granulocyte percent 67.5 37 - 80 %G   RBC 5.45 4.69 - 6.13 M/uL   Hemoglobin 16.1 14.1 - 18.1 g/dL   HCT, POC 16.1 09.6 - 53.7 %   MCV 85.8 80 - 97 fL   MCH, POC 29.5 27 - 31.2 pg   MCHC 34.4 31.8 - 35.4 g/dL   RDW, POC 04.5 %   Platelet Count, POC 254 142 - 424 K/uL   MPV 7.3 0 - 99.8 fL  POCT glucose (manual entry)  Result Value Ref Range   POC Glucose 89 70 - 99 mg/dl     Assessment and Plan: [name] is a 40 y.o. male who is here today for urinary retention.  Vitals are on concerning at this time. There is concern of prostatitis, urinary tract infection, benign prostatic hyperplasia.  We will start the Bactrim at this time for possible urinary tract infection. Urine culture placed. I will also check PSA at this time. I have advised him to return a gonorrhea chlamydia urine probe the next day. He was given cup and voiced understanding.   Urinary retention - Plan: POCT Microscopic Urinalysis ([facility]), POCT urinalysis dipstick, POCT CBC, TSH, PSA, POCT glucose (manual entry), Urine culture, sulfamethoxazole-trimethoprim (BACTRIM DS,SEPTRA DS) 800-160 MG tablet, GC/Chlamydia Probe Amp  Constipation, unspecified constipation type - Plan: TSH, PSA, POCT glucose (manual entry), polyethylene glycol powder (GLYCOLAX/MIRALAX) powder  Dysuria - Plan: GC/Chlamydia Probe Amp  Trena Platt, PA-C [facility] 04/08/2016 1:08 PM

## 2016-04-08 NOTE — Patient Instructions (Addendum)
     IF you received an x-ray today, you will receive an invoice from [facility]. Please contact [facility] at [phone] with questions or concerns regarding your invoice.   IF you received labwork today, you will receive an invoice from [facility]. Please contact [facility] at [phone] with questions or concerns regarding your invoice.   Our billing staff will not be able to assist you with questions regarding bills from these companies.  You will be contacted with the lab results as soon as they are available. The fastest way to get your results is to activate your My Chart account. Instructions are located on the last page of this paperwork. If you have not heard from us regarding the results in 2 weeks, please contact this office.    I would like you to take the miralax twice per day, until your stool is nice and loose.  Take twice per day no longer than one week.  You can then do once per day. I would like you to take antibiotic as prescribed.   You will also return the urine sample tomorrow.   Please return to be seen if your symptoms fail to improve.

## 2016-04-09 LAB — PSA: PSA: 4.13 ng/mL — ABNORMAL HIGH (ref <=4.00)

## 2016-04-10 LAB — URINE CULTURE
Colony Count: NO GROWTH
ORGANISM ID, BACTERIA: NO GROWTH

## 2016-04-12 NOTE — Addendum Note (Signed)
Addended by: Marcellus ScottADKINS, CONNIE L on: 04/12/2016 12:12 PM   Modules accepted: Orders

## 2016-04-13 LAB — GC/CHLAMYDIA PROBE AMP
CT Probe RNA: NOT DETECTED
GC Probe RNA: NOT DETECTED

## 2016-04-14 ENCOUNTER — Telehealth: Payer: Self-pay | Admitting: Emergency Medicine

## 2016-04-14 ENCOUNTER — Other Ambulatory Visit: Payer: Self-pay | Admitting: Physician Assistant

## 2016-04-14 DIAGNOSIS — R339 Retention of urine, unspecified: Secondary | ICD-10-CM

## 2016-04-14 MED ORDER — SULFAMETHOXAZOLE-TRIMETHOPRIM 800-160 MG PO TABS: 1.0000 | ORAL_TABLET | Freq: Two times a day (BID) | ORAL | Status: DC

## 2016-04-14 NOTE — Telephone Encounter (Signed)
-----   Message from Garnetta BuddyStephanie D English, [place] sent at 04/14/2016  3:27 PM EDT ----- Please alert that his gonorrhea and chlamydia was normal.  psa slightly elevated.  This could be infectious.  Please ask how he is doing.  I am adding three more days of the bactrim to complete a 10 day total of antibiotic.  Please pick up from pharmacy.  He should come back in 2 weeks. For recheck of psa.  Please let me know how he is doing.

## 2016-04-14 NOTE — Telephone Encounter (Signed)
Attempted to give patients test results, number listed does not take incoming calls.

## 2016-05-06 ENCOUNTER — Ambulatory Visit (INDEPENDENT_AMBULATORY_CARE_PROVIDER_SITE_OTHER): Payer: PRIVATE HEALTH INSURANCE | Admitting: Physician Assistant

## 2016-05-06 VITALS — BP 124/88 | HR 83 | Temp 97.8°F | Resp 18 | Ht 65.0 in | Wt 245.0 lb

## 2016-05-06 DIAGNOSIS — Z0000 Encounter for general adult medical examination without abnormal findings: Secondary | ICD-10-CM | POA: Diagnosis not present

## 2016-05-06 DIAGNOSIS — R972 Elevated prostate specific antigen [PSA]: Secondary | ICD-10-CM | POA: Diagnosis not present

## 2016-05-06 DIAGNOSIS — G4733 Obstructive sleep apnea (adult) (pediatric): Secondary | ICD-10-CM | POA: Diagnosis not present

## 2016-05-06 DIAGNOSIS — Z13220 Encounter for screening for lipoid disorders: Secondary | ICD-10-CM | POA: Diagnosis not present

## 2016-05-06 DIAGNOSIS — N529 Male erectile dysfunction, unspecified: Secondary | ICD-10-CM | POA: Diagnosis not present

## 2016-05-06 DIAGNOSIS — Z113 Encounter for screening for infections with a predominantly sexual mode of transmission: Secondary | ICD-10-CM

## 2016-05-06 LAB — COMPREHENSIVE METABOLIC PANEL
ALT: 30 U/L (ref 9–46)
AST: 26 U/L (ref 10–40)
Albumin: 4.3 g/dL (ref 3.6–5.1)
Alkaline Phosphatase: 51 U/L (ref 40–115)
BILIRUBIN TOTAL: 0.8 mg/dL (ref 0.2–1.2)
BUN: 12 mg/dL (ref 7–25)
CALCIUM: 9.7 mg/dL (ref 8.6–10.3)
CO2: 23 mmol/L (ref 20–31)
Chloride: 101 mmol/L (ref 98–110)
Creat: 0.98 mg/dL (ref 0.60–1.35)
GLUCOSE: 77 mg/dL (ref 65–99)
Potassium: 3.8 mmol/L (ref 3.5–5.3)
Sodium: 137 mmol/L (ref 135–146)
Total Protein: 7.3 g/dL (ref 6.1–8.1)

## 2016-05-06 LAB — LIPID PANEL
CHOL/HDL RATIO: 3 ratio (ref <=5.0)
Cholesterol: 152 mg/dL (ref 125–200)
HDL: 50 mg/dL (ref >=40)
LDL CALC: 74 mg/dL (ref <130)
Triglycerides: 139 mg/dL (ref <150)
VLDL: 28 mg/dL (ref <30)

## 2016-05-06 LAB — POC MICROSCOPIC URINALYSIS (UMFC): Mucus: ABSENT

## 2016-05-06 LAB — CBC
HEMATOCRIT: 46.5 % (ref 38.5–50.0)
Hemoglobin: 16.2 g/dL (ref 13.2–17.1)
MCH: 29.7 pg (ref 27.0–33.0)
MCHC: 34.8 g/dL (ref 32.0–36.0)
MCV: 85.2 fL (ref 80.0–100.0)
MPV: 8.9 fL (ref 7.5–12.5)
PLATELETS: 315 10*3/uL (ref 140–400)
RBC: 5.46 MIL/uL (ref 4.20–5.80)
RDW: 14.8 % (ref 11.0–15.0)
WBC: 7.4 10*3/uL (ref 3.8–10.8)

## 2016-05-06 LAB — POCT URINALYSIS DIP (MANUAL ENTRY)
BILIRUBIN UA: NEGATIVE
BILIRUBIN UA: NEGATIVE
GLUCOSE UA: NEGATIVE
LEUKOCYTES UA: NEGATIVE
Nitrite, UA: NEGATIVE
PH UA: 5.5
Protein Ur, POC: NEGATIVE
Spec Grav, UA: 1.01
Urobilinogen, UA: 0.2

## 2016-05-06 MED ORDER — TADALAFIL 20 MG PO TABS: 10.0000 mg | ORAL_TABLET | ORAL | Status: DC | PRN

## 2016-05-06 NOTE — Patient Instructions (Addendum)
I will call you with your lab results. Work on diet, exercise and weight loss - this is very important esp for your sleep apnea. See diet below. May start taking cialis before sex, start with 1/2 tab.     Why follow it? Research shows. . Those who follow the Mediterranean diet have a reduced risk of heart disease  . The diet is associated with a reduced incidence of Parkinson's and Alzheimer's diseases . People following the diet may have longer life expectancies and lower rates of chronic diseases  . The Dietary Guidelines for Americans recommends the Mediterranean diet as an eating plan to promote health and prevent disease  What Is the Mediterranean Diet?  . Healthy eating plan based on typical foods and recipes of Mediterranean-style cooking . The diet is primarily a plant based diet; these foods should make up a majority of meals   Starches - Plant based foods should make up a majority of meals - They are an important sources of vitamins, minerals, energy, antioxidants, and fiber - Choose whole grains, foods high in fiber and minimally processed items  - Typical grain sources include wheat, oats, barley, corn, brown rice, bulgar, farro, millet, polenta, couscous  - Various types of beans include chickpeas, lentils, fava beans, black beans, white beans   Fruits  Veggies - Large quantities of antioxidant rich fruits & veggies; 6 or more servings  - Vegetables can be eaten raw or lightly drizzled with oil and cooked  - Vegetables common to the traditional Mediterranean Diet include: artichokes, arugula, beets, broccoli, brussel sprouts, cabbage, carrots, celery, collard greens, cucumbers, eggplant, kale, leeks, lemons, lettuce, mushrooms, okra, onions, peas, peppers, potatoes, pumpkin, radishes, rutabaga, shallots, spinach, sweet potatoes, turnips, zucchini - Fruits common to the Mediterranean Diet include: apples, apricots, avocados, cherries, clementines, dates, figs, grapefruits, grapes,  melons, nectarines, oranges, peaches, pears, pomegranates, strawberries, tangerines  Fats - Replace butter and margarine with healthy oils, such as olive oil, canola oil, and tahini  - Limit nuts to no more than a handful a day  - Nuts include walnuts, almonds, pecans, pistachios, pine nuts  - Limit or avoid candied, honey roasted or heavily salted nuts - Olives are central to the [employer]iterranean diet - can be eaten whole or used in a variety of dishes   Meats Protein - Limiting red meat: no more than a few times a month - When eating red meat: choose lean cuts and keep the portion to the size of deck of cards - Eggs: approx. 0 to 4 times a week  - Fish and lean poultry: at least 2 a week  - Healthy protein sources include, chicken, [place]key, lean beef, lamb - Increase intake of seafood such as tuna, salmon, trout, mackerel, shrimp, scallops - Avoid or limit high fat processed meats such as sausage and bacon  Dairy - Include moderate amounts of low fat dairy products  - Focus on healthy dairy such as fat free yogurt, skim milk, low or reduced fat cheese - Limit dairy products higher in fat such as whole or 2% milk, cheese, ice cream  Alcohol - Moderate amounts of red wine is ok  - No more than 5 oz daily for women (all ages) and men older than age 38  - No more than 10 oz of wine daily for men younger than 4365  Other - Limit sweets and other desserts  - Use herbs and spices instead of salt to flavor foods  - Herbs and spices common  to the traditional Mediterranean Diet include: basil, bay leaves, chives, cloves, cumin, fennel, garlic, lavender, marjoram, mint, oregano, parsley, pepper, rosemary, sage, savory, sumac, tarragon, thyme   It's not just a diet, it's a lifestyle:  . The Mediterranean diet includes lifestyle factors typical of those in the region  . Foods, drinks and meals are best eaten with others and savored . Daily physical activity is important for overall good health . This could  be strenuous exercise like running and aerobics . This could also be more leisurely activities such as walking, housework, yard-work, or taking the stairs . Moderation is the key; a balanced and healthy diet accommodates most foods and drinks . Consider portion sizes and frequency of consumption of certain foods   Meal Ideas & Options:  . Breakfast:  o Whole wheat toast or whole wheat English muffins with peanut butter & hard boiled egg o Steel cut oats topped with apples & cinnamon and skim milk  o Fresh fruit: banana, strawberries, melon, berries, peaches  o Smoothies: strawberries, bananas, greek yogurt, peanut butter o Low fat greek yogurt with blueberries and granola  o Egg white omelet with spinach and mushrooms o Breakfast couscous: whole wheat couscous, apricots, skim milk, cranberries  . Sandwiches:  o Hummus and grilled vegetables (peppers, zucchini, squash) on whole wheat bread   o Grilled chicken on whole wheat pita with lettuce, tomatoes, cucumbers or tzatziki  o Tuna salad on whole wheat bread: tuna salad made with greek yogurt, olives, red peppers, capers, green onions o Garlic rosemary lamb pita: lamb sauted with garlic, rosemary, salt & pepper; add lettuce, cucumber, greek yogurt to pita - flavor with lemon juice and black pepper  . Seafood:  o Mediterranean grilled salmon, seasoned with garlic, basil, parsley, lemon juice and black pepper o Shrimp, lemon, and spinach whole-grain pasta salad made with low fat greek yogurt  o Seared scallops with lemon orzo  o Seared tuna steaks seasoned salt, pepper, coriander topped with tomato mixture of olives, tomatoes, olive oil, minced garlic, parsley, green onions and cappers  . Meats:  o Herbed greek chicken salad with kalamata olives, cucumber, feta  o Red bell peppers stuffed with spinach, bulgur, lean ground beef (or lentils) & topped with feta   o Kebabs: skewers of chicken, tomatoes, onions, zucchini, squash  o Mal[place]key  burgers: made with red onions, mint, dill, lemon juice, feta cheese topped with roasted red peppers . Vegetarian o Cucumber salad: cucumbers, artichoke hearts, celery, red onion, feta cheese, tossed in olive oil & lemon juice  o Hummus and whole grain pita points with a greek salad (lettuce, tomato, feta, olives, cucumbers, red onion) o Lentil soup with celery, carrots made with vegetable broth, garlic, salt and pepper  o Tabouli salad: parsley, bulgur, mint, scallions, cucumbers, tomato, radishes, lemon juice, olive oil, salt and pepper.         IF you received an x-ray today, you will receive an invoice from [facility]. Please contact [facility] at [phone] with questions or concerns regarding your invoice.   IF you received labwork today, you will receive an invoice from [facility]. Please contact [facility] at [phone] with questions or concerns regarding your invoice.   Our billing staff will not be able to assist you with questions regarding bills from these companies.  You will be contacted with the lab results as soon as they are available. The fastest way to get your results is to activate your My Chart account.  Instructions are located on the last page of this paperwork. If you have not heard from [place] regarding the results in 2 weeks, please contact this office.

## 2016-05-06 NOTE — Progress Notes (Signed)
[facility] [address], [place] [address] [phone]  Date:  05/06/2016   Name:  Richard Berger   DOB:  1976/02/23   MRN:  [id number]  PCP:  Tally Due, MD    Chief Complaint: Annual Exam   History of Present Illness:  This is a 40 y.o. male who is presenting for CPE.  Pt was seen here 1 month ago with dysuria, urinary frequency, urinary hesitancy and constipation. UA with mod leuks and large blood. PSA mildly elevated at 4.13. G/C neg. Urine culture without growth. Was treated for prostatitis with bactrim bid x 10 days. He is reporting today that he is feeling much better. No longer having any of the above symptoms.  He is wanting to try something for erectile dysfunction. States his erections are not as strong as they used to be. He is even noticing that his morning erections are not as strong and same when he masturbates. He tried viagra 6-7 years ago and states it did not work for him. He is wanting to try cialis. He denies problems with exertional cp or sob. He can climb a flight of stairs without problems. His job is very active and he has no problems at work.  He states he has OSA dx'd by sleep study and has been told he needs a CPAP but he does not want to do this. He states he would rather take a pill. Sleeping with a machine makes him very uncomfortable.  Immunizations: tdap 2015 Dentist: does not have dental insurance. States he has no problems but if he did he would go to the dentist. Eye: had cataracts removed from right eye 5-6 years ago. Wears glasses. No new problems. Diet: Eats a lot of rice. States he knows it is not good but is part of his culture. Does not eat meat. Eats a lot of vegetables. Mostly drinks water. Drinks a soda once in a while.  Exercise:only exercise is at job. 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 Fam hx: no fam hx heart disease, DM, HTN, CVA, cancer. Sexual hx: sexually active with women. Wears  condoms. Urinary hesitancy/frequency or nocturia: no urinary hesitancy or frequency. He does urinate 3 times a night but states this has been this way for a long time and he attributes it to drinking a lot of water before bed. Tobacco/alcohol/substance use: 2 cigs per day/2 drinks per week/no   Review of Systems:  Review of Systems  Constitutional: Negative.   HENT: Negative.   Eyes: Positive for visual disturbance.  Respiratory: Negative.   Cardiovascular: Negative.   Gastrointestinal: Negative.   Endocrine: Negative.   Genitourinary: Negative.   Musculoskeletal: Negative.   Skin: Negative.   Allergic/Immunologic: Negative.   Neurological: Negative.   Hematological: Negative.   Psychiatric/Behavioral: Negative.     Patient Active Problem List   Diagnosis Date Noted  . Morbid obesity (HCC) 05/02/2015  . Obesity 01/17/2015  . Hyperuricemia 06/27/2014  . 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 Gout 06/09/2012    Prior to Admission medications   Medication Sig Start Date End Date Taking? Authorizing Provider  polyethylene glycol powder (GLYCOLAX/MIRALAX) powder Take 17 g by mouth 2 (two) times daily as needed. Patient not taking: Reported on 05/06/2016 04/08/16   Collie Siad English, PA  sulfamethoxazole-trimethoprim (BACTRIM DS,SEPTRA DS) 800-160 MG tablet Take 1 tablet by mouth 2 (two) times daily. Patient not taking: Reported on 05/06/2016 04/14/16   Collie Siad  English, PA    No Known Allergies  Past Surgical History  Procedure Laterality Date  . Eye surgery Right     Cataract    Social History  Substance Use Topics  . Smoking status: Current Some Day Smoker -- 0.40 packs/day for 6 years    Types: Cigarettes  . Smokeless tobacco: Never Used  . Alcohol Use: 2.4 oz/week    2 Glasses of wine, 2 Cans of beer per week    No family history on file.  Medication list has been reviewed and updated.  Physical Examination:  Physical Exam  Constitutional: He is oriented  to person, place, and time. He appears well-developed and well-nourished.  HENT:  Head: Normocephalic and atraumatic.  Right Ear: Hearing, tympanic membrane, external ear and ear canal normal.  Left Ear: Hearing, tympanic membrane, external ear and ear canal normal.  Nose: Nose normal.  Mouth/Throat: Uvula is midline, oropharynx is clear and moist and mucous membranes are normal.  mallampati 4  Eyes: Conjunctivae and lids are normal. Pupils are equal, round, and reactive to light. Right eye exhibits no discharge. Left eye exhibits no discharge. No scleral icterus.  Right eye exotropia  Neck: Trachea normal and normal range of motion. Neck supple. Carotid bruit is not present. No thyromegaly present.  Cardiovascular: Normal rate, regular rhythm, normal heart sounds, intact distal pulses and normal pulses.   No murmur heard. Pulmonary/Chest: Effort normal and breath sounds normal. No respiratory distress. He has no wheezes. He has no rhonchi. He has no rales.  Abdominal: Soft. Normal appearance. There is no tenderness.  obese  Musculoskeletal: Normal range of motion. He exhibits no edema or tenderness.  Lymphadenopathy:       Head (right side): No submental, no submandibular and no tonsillar adenopathy present.       Head (left side): No submental, no submandibular and no tonsillar adenopathy present.    He has no cervical adenopathy.       Right: No supraclavicular adenopathy present.       Left: No supraclavicular adenopathy present.  Neurological: He is alert and oriented to person, place, and time. No cranial nerve deficit. Coordination and gait normal.  Skin: Skin is warm, dry and intact. No lesion and no rash noted.  Psychiatric: He has a normal mood and affect. His speech is normal and behavior is normal. Judgment and thought content normal.   BP 124/88 mmHg  Pulse 83  Temp(Src) 97.8 F (36.6 C) (Oral)  Resp 18  Ht 5\' 5"  (1.651 m)  Wt 245 lb (111.131 kg)  BMI 40.77 kg/m2  SpO2  99%   Visual Acuity Screening   Right eye Left eye Both eyes  Without correction:     With correction: 20 30 20 30 20 30    Results for orders placed or performed in visit on 05/06/16  POCT urinalysis dipstick  Result Value Ref Range   Color, UA yellow yellow   Clarity, UA clear clear   Glucose, UA negative negative   Bilirubin, UA negative negative   Ketones, POC UA negative negative   Spec Grav, UA 1.010    Blood, UA trace-intact (A) negative   pH, UA 5.5    Protein Ur, POC negative negative   Urobilinogen, UA 0.2    Nitrite, UA Negative Negative   Leukocytes, UA Negative Negative  POCT Microscopic Urinalysis ([facility])  Result Value Ref Range   WBC,UR,HPF,POC None None WBC/hpf   RBC,UR,HPF,POC None None RBC/hpf   Bacteria None None,  Too numerous to count   Mucus Absent Absent   Epithelial Cells, UR Per Microscopy None None, Too numerous to count cells/hpf   Assessment and Plan:  1. Annual physical exam - CBC  2. Morbid obesity, unspecified obesity type (HCC) We discussed the importance of diet and weight loss. Exercise most days outside of work. Cut down on rice. This is very important for controlling his OSA since he refuses to use his CPAP. - Comprehensive metabolic panel  3. Lipid screening - Lipid panel  4. Elevated PSA Prostatitis 1 month ago with mildly elevated PSA. UA now normal. Recheck PSA. - PSA - POCT urinalysis dipstick - POCT Microscopic Urinalysis ([facility])  5. Screen for STD (sexually transmitted disease) - HIV antibody - RPR  6. Erectile dysfunction, unspecified erectile dysfunction type - tadalafil (CIALIS) 20 MG tablet; Take 0.5-1 tablets (10-20 mg total) by mouth every other day as needed for erectile dysfunction.  Dispense: 5 tablet; Refill: 5  7. OSA (obstructive sleep apnea) See above. Pt refuses to wear CPAP. I counseled extensively on weight loss.   Roswell MinersNicole V. Dyke BrackettBush, PA-C, MHS [facility]  05/06/2016

## 2016-05-07 LAB — RPR

## 2016-05-07 LAB — PSA: PSA: 2.45 ng/mL (ref <=4.00)

## 2016-05-07 LAB — HIV ANTIBODY (ROUTINE TESTING W REFLEX): HIV: NONREACTIVE

## 2016-05-12 ENCOUNTER — Telehealth: Payer: Self-pay | Admitting: *Deleted

## 2016-05-12 NOTE — Telephone Encounter (Signed)
Calling patient about lab results.

## 2016-05-19 ENCOUNTER — Encounter: Payer: Self-pay | Admitting: *Deleted

## 2016-10-06 ENCOUNTER — Ambulatory Visit (INDEPENDENT_AMBULATORY_CARE_PROVIDER_SITE_OTHER): Payer: PRIVATE HEALTH INSURANCE | Admitting: Physician Assistant

## 2016-10-06 VITALS — BP 112/78 | HR 92 | Temp 97.7°F | Ht 65.0 in | Wt 238.0 lb

## 2016-10-06 DIAGNOSIS — B9789 Other viral agents as the cause of diseases classified elsewhere: Secondary | ICD-10-CM

## 2016-10-06 DIAGNOSIS — J069 Acute upper respiratory infection, unspecified: Secondary | ICD-10-CM | POA: Diagnosis not present

## 2016-10-06 DIAGNOSIS — M109 Gout, unspecified: Secondary | ICD-10-CM | POA: Diagnosis not present

## 2016-10-06 MED ORDER — IPRATROPIUM BROMIDE 0.03 % NA SOLN: 2.0000 | Freq: Two times a day (BID) | NASAL | 0 refills | Status: DC

## 2016-10-06 MED ORDER — HYDROCODONE-HOMATROPINE 5-1.5 MG/5ML PO SYRP
5.0000 mL | ORAL_SOLUTION | Freq: Every evening | ORAL | 0 refills | Status: AC | PRN
Start: 2016-10-06 — End: 2016-10-09

## 2016-10-06 MED ORDER — BENZONATATE 100 MG PO CAPS: 100.0000 mg | ORAL_CAPSULE | Freq: Three times a day (TID) | ORAL | 0 refills | Status: DC | PRN

## 2016-10-06 MED ORDER — COLCHICINE 0.6 MG PO TABS: ORAL_TABLET | ORAL | 0 refills | Status: DC

## 2016-10-06 NOTE — Progress Notes (Signed)
[facility] [address], [place] [address] [phone]  Date:  10/06/2016   Name:  Richard Berger   DOB:  02-09-1976   MRN:  [id number]  PCP:  Tally DueGUEST, CHRIS WARREN, MD   History of Present Illness:  Richard Berger is a 40 y.o. male patient [facility] nasal congestion and cough, and ankle pain.    --1 week, coughing and nasal congestion, and pain in his face.  Abdominal pain secondary to coughing.  No ear pain.  He has sore throat.  No fever.  He is taking nyquil which does not work.  He feels as if his symptoms are worsening.   --foot is swollen along the front of his ankle.  Hard to walk.  No trauma.    Patient Active Problem List   Diagnosis Date Noted  . OSA (obstructive sleep apnea) 05/06/2016  . Erectile dysfunction 05/06/2016  . Morbid obesity (HCC) 05/02/2015  . Obesity 01/17/2015  . Hyperuricemia 06/27/2014  . 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 Gout 06/09/2012    Past Medical History:  Diagnosis Date  . Cataract   . Gout     Past Surgical History:  Procedure Laterality Date  . EYE SURGERY Right    Cataract    Social History  Substance Use Topics  . Smoking status: Current Some Day Smoker    Packs/day: 0.40    Years: 6.00    Types: Cigarettes  . Smokeless tobacco: Never Used  . Alcohol use 2.4 oz/week    2 Glasses of wine, 2 Cans of beer per week    History reviewed. No pertinent family history.  No Known Allergies  Medication list has been reviewed and updated.  Current Outpatient Prescriptions on File Prior to Visit  Medication Sig Dispense Refill  . tadalafil (CIALIS) 20 MG tablet Take 0.5-1 tablets (10-20 mg total) by mouth every other day as needed for erectile dysfunction. (Patient not taking: Reported on 10/06/2016) 5 tablet 5   No current facility-administered medications on file prior to visit.     ROS ROS otherwise unremarkable unless listed above.   Physical Examination: BP 112/78 (BP  Location: Right Arm, Patient Position: Sitting, Cuff Size: Large)   Pulse 92   Temp 97.7 F (36.5 C) (Oral)   Ht 5\' 5"  (1.651 m)   Wt 238 lb (108 kg)   SpO2 99%   BMI 39.61 kg/m  Ideal Body Weight: Weight in (lb) to have BMI = 25: 149.9  Physical Exam  Constitutional: He is oriented to person, place, and time. He appears well-developed and well-nourished. No distress.  HENT:  Head: Atraumatic.  Right Ear: Tympanic membrane, external ear and ear canal normal.  Left Ear: Tympanic membrane, external ear and ear canal normal.  Nose: Mucosal edema and rhinorrhea present. Right sinus exhibits no maxillary sinus tenderness and no frontal sinus tenderness. Left sinus exhibits no maxillary sinus tenderness and no frontal sinus tenderness.  Mouth/Throat: No uvula swelling. No oropharyngeal exudate, posterior oropharyngeal edema or posterior oropharyngeal erythema.  Eyes: Conjunctivae, EOM and lids are normal. Pupils are equal, round, and reactive to light. Right eye exhibits normal extraocular motion. Left eye exhibits normal extraocular motion.  Neck: Trachea normal and full passive range of motion without pain. No edema and no erythema present.  Cardiovascular: Normal rate.   Pulmonary/Chest: Effort normal. No respiratory distress. He has no decreased breath sounds. He has no wheezes. He has no rhonchi.  Musculoskeletal:       Right ankle: No lateral malleolus and no medial malleolus tenderness found. Achilles tendon normal.  Tender along the talus and anterior ankle.  Pain with passive dorsi/plantar flexion however rom is normal.    Neurological: He is alert and oriented to person, place, and time.  Skin: Skin is warm and dry. He is not diaphoretic.  Psychiatric: He has a normal mood and affect. His behavior is normal.     Assessment and Plan: Richard Berger is a 40 y.o. male who is here today for cc of nasal congestion, cough, and ankle pain.  Viral URI with cough - Plan:  HYDROcodone-homatropine (HYCODAN) 5-1.5 MG/5ML syrup, benzonatate (TESSALON) 100 MG capsule, ipratropium (ATROVENT) 0.03 % nasal spray  Acute gout of right ankle, unspecified cause - Plan: colchicine (COLCRYS) 0.6 MG tablet  Trena PlattStephanie Jamario Colina, PA-C [facility] 12/4/20179:41 AM

## 2016-10-06 NOTE — Patient Instructions (Addendum)
  Please hydrate well with 64 oz of water.  I would like to use the mucinex as well.   The tessalon pearls will help with the cough.    You can do the colcrys for your ankle pain.       IF you received an x-ray today, you will receive an invoice from [facility]. Please contact [facility] at [phone] with questions or concerns regarding your invoice.   IF you received labwork today, you will receive an invoice from [facility]. Please contact [facility] at [phone] with questions or concerns regarding your invoice.   Our billing staff will not be able to assist you with questions regarding bills from these companies.  You will be contacted with the lab results as soon as they are available. The fastest way to get your results is to activate your My Chart account. Instructions are located on the last page of this paperwork. If you have not heard from us regarding the results in 2 weeks, please contact this office.

## 2016-10-09 ENCOUNTER — Telehealth: Payer: Self-pay

## 2016-10-09 MED ORDER — AZITHROMYCIN 250 MG PO TABS: ORAL_TABLET | ORAL | 0 refills | Status: DC

## 2016-10-09 NOTE — Telephone Encounter (Signed)
Pt says his illness has turned into an infection and he needs an antibiotic.  He says his cough is making his stomach hurt and he can hear the congestion.  Cal  [phone]. [facility] and [facility] says to please call him in azithromycin (z-pack).

## 2016-10-09 NOTE — Telephone Encounter (Signed)
Thank you prescription signed.

## 2016-10-09 NOTE — Telephone Encounter (Signed)
I have pended this for you, unfortunately I can not take the [facility], please submit Rx

## 2016-12-01 ENCOUNTER — Ambulatory Visit (INDEPENDENT_AMBULATORY_CARE_PROVIDER_SITE_OTHER): Payer: PRIVATE HEALTH INSURANCE | Admitting: Emergency Medicine

## 2016-12-01 ENCOUNTER — Ambulatory Visit (INDEPENDENT_AMBULATORY_CARE_PROVIDER_SITE_OTHER): Payer: PRIVATE HEALTH INSURANCE

## 2016-12-01 VITALS — BP 126/80 | HR 78 | Temp 97.5°F | Resp 16 | Ht 65.0 in | Wt 228.0 lb

## 2016-12-01 DIAGNOSIS — R091 Pleurisy: Secondary | ICD-10-CM | POA: Insufficient documentation

## 2016-12-01 DIAGNOSIS — R05 Cough: Secondary | ICD-10-CM

## 2016-12-01 DIAGNOSIS — J209 Acute bronchitis, unspecified: Secondary | ICD-10-CM | POA: Diagnosis not present

## 2016-12-01 DIAGNOSIS — R059 Cough, unspecified: Secondary | ICD-10-CM

## 2016-12-01 MED ORDER — PROMETHAZINE-DM 6.25-15 MG/5ML PO SYRP: 5.0000 mL | ORAL_SOLUTION | Freq: Four times a day (QID) | ORAL | 0 refills | Status: AC | PRN

## 2016-12-01 MED ORDER — AMOXICILLIN-POT CLAVULANATE 875-125 MG PO TABS: 1.0000 | ORAL_TABLET | Freq: Two times a day (BID) | ORAL | 0 refills | Status: DC

## 2016-12-01 NOTE — Progress Notes (Signed)
Richard Berger 41 y.o.   Chief Complaint  Patient presents with  . Cough    pt states he is feeling worse from the last visit  . Chest Pain    under ribs, hurts when coughing    HISTORY OF PRESENT ILLNESS: This is a 41 y.o. male complaining of productive cough and pleuritic chest pain with chest congestion since last December.  Cough  This is a new problem. The current episode started more than 1 month ago. The problem has been gradually worsening. The problem occurs constantly. The cough is productive of sputum. Associated symptoms include chest pain (pleuritic) and nasal congestion. Pertinent negatives include no chills, eye redness, fever, hemoptysis, myalgias, postnasal drip, rash, sore throat, shortness of breath or wheezing. Nothing aggravates the symptoms. Risk factors for lung disease include smoking/tobacco exposure. He has tried OTC cough suppressant for the symptoms. The treatment provided no relief.     Prior to Admission medications   Medication Sig Start Date End Date Taking? Authorizing Provider  azithromycin (ZITHROMAX Z-PAK) 250 MG tablet 2 tablets day one, then one tablet daily until gone Patient not taking: Reported on 12/01/2016 10/09/16   Judeth Cornfield D English, PA  benzonatate (TESSALON) 100 MG capsule Take 1-2 capsules (100-200 mg total) by mouth 3 (three) times daily as needed for cough. Patient not taking: Reported on 12/01/2016 10/06/16   Collie Siad English, PA  colchicine (COLCRYS) 0.6 MG tablet TAKE 2 tablets at the first sign of flare, followed in 1 hour with 1 tablet. Patient not taking: Reported on 12/01/2016 10/06/16   Collie Siad English, PA  ipratropium (ATROVENT) 0.03 % nasal spray Place 2 sprays into both nostrils 2 (two) times daily. Patient not taking: Reported on 12/01/2016 10/06/16   Collie Siad English, PA  tadalafil (CIALIS) 20 MG tablet Take 0.5-1 tablets (10-20 mg total) by mouth every other day as needed for erectile dysfunction. Patient not taking:  Reported on 10/06/2016 05/06/16   Dorna Leitz, PA-C    No Known Allergies  Patient Active Problem List   Diagnosis Date Noted  . OSA (obstructive sleep apnea) 05/06/2016  . Erectile dysfunction 05/06/2016  . Morbid obesity (HCC) 05/02/2015  . Obesity 01/17/2015  . Hyperuricemia 06/27/2014  . 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 Gout 06/09/2012    Past Medical History:  Diagnosis Date  . Cataract   . Gout     Past Surgical History:  Procedure Laterality Date  . EYE SURGERY Right    Cataract    Social History   Social History  . Marital status: Single    Spouse name: n/a  . Number of children: 1  . Years of education: 8   Occupational History  . Investment banker, operational   Social History Main Topics  . Smoking status: Current Some Day Smoker    Packs/day: 0.40    Years: 6.00    Types: Cigarettes  . Smokeless tobacco: Never Used  . Alcohol use 2.4 oz/week    2 Glasses of wine, 2 Cans of beer per week  . Drug use: No  . Sexual activity: No   Other Topics Concern  . Not on file   Social History Narrative   Marital status: single; dating. Moved from [place] to [place] in 2001 with his father.  Family (1 brother and 1 sister) in [place]. Mother died there 2013-02-02.      Children: one son in [place].      Employment: employed  Tobacco: 1-2 cigarettes a week.      Alcohol: alcohol on weekends.         History reviewed. No pertinent family history.   Review of Systems  Constitutional: Negative for chills and fever.  HENT: Positive for congestion. Negative for nosebleeds, postnasal drip, sinus pain and sore throat.   Eyes: Negative for discharge and redness.  Respiratory: Positive for cough and sputum production. Negative for hemoptysis, shortness of breath and wheezing.   Cardiovascular: Positive for chest pain (pleuritic). Negative for palpitations and leg swelling.  Gastrointestinal: Negative for abdominal pain, diarrhea, nausea  and vomiting.  Genitourinary: Negative for dysuria and hematuria.  Musculoskeletal: Negative for back pain, myalgias and neck pain.  Skin: Negative for rash.  Neurological: Negative.   Endo/Heme/Allergies: Negative.   Psychiatric/Behavioral: Negative.   All other systems reviewed and are negative.  Vitals:   12/01/16 1120  BP: 126/80  Pulse: 78  Resp: 16  Temp: 97.5 F (36.4 C)   CXR reviewed: No infiltrates seen; +peribronchial cuffing.  Physical Exam  Constitutional: He is oriented to person, place, and time. He appears well-developed and well-nourished.  HENT:  Head: Normocephalic and atraumatic.  Nose: Nose normal.  Mouth/Throat: Oropharynx is clear and moist.  Eyes: Conjunctivae and EOM are normal. Pupils are equal, round, and reactive to light.  Neck: Normal range of motion. Neck supple.  Cardiovascular: Normal rate, regular rhythm, normal heart sounds and intact distal pulses.   Pulmonary/Chest: Effort normal and breath sounds normal.  Abdominal: Soft. Bowel sounds are normal. There is no tenderness.  Musculoskeletal: Normal range of motion.  Neurological: He is alert and oriented to person, place, and time. He displays normal reflexes. No sensory deficit. He exhibits normal muscle tone.  Skin: Skin is warm and dry. Capillary refill takes less than 2 seconds.  Psychiatric: He has a normal mood and affect. His behavior is normal.  Vitals reviewed.    ASSESSMENT & PLAN: Richard Berger was seen today for cough and chest pain.  Diagnoses and all orders for this visit:  Acute bronchitis, unspecified organism -     DG Chest 2 View; Future  Cough  Pleurisy  Other orders -     amoxicillin-clavulanate (AUGMENTIN) 875-125 MG tablet; Take 1 tablet by mouth 2 (two) times daily. -     promethazine-dextromethorphan (PROMETHAZINE-DM) 6.25-15 MG/5ML syrup; Take 5 mLs by mouth 4 (four) times daily as needed for cough.    Patient Instructions       IF you received an x-ray  today, you will receive an invoice from [facility]. Please contact [facility] at [phone] with questions or concerns regarding your invoice.   IF you received labwork today, you will receive an invoice from [facility]. Please contact [facility] at [phone] with questions or concerns regarding your invoice.   Our billing staff will not be able to assist you with questions regarding bills from these companies.  You will be contacted with the lab results as soon as they are available. The fastest way to get your results is to activate your My Chart account. Instructions are located on the last page of this paperwork. If you have not heard from us regarding the results in 2 weeks, please contact this office.      Acute Bronchitis, Adult Acute bronchitis is when air tubes (bronchi) in the lungs suddenly get swollen. The condition can make it hard to breathe. It can also cause these symptoms:  A cough.  Coughing up clear, yellow, or green  mucus.  Wheezing.  Chest congestion.  Shortness of breath.  A fever.  Body aches.  Chills.  A sore throat. Follow these instructions at home: Medicines  Take over-the-counter and prescription medicines only as told by your doctor.  If you were prescribed an antibiotic medicine, take it as told by your doctor. Do not stop taking the antibiotic even if you start to feel better. General instructions  Rest.  Drink enough fluids to keep your pee (urine) clear or pale yellow.  Avoid smoking and secondhand smoke. If you smoke and you need help quitting, ask your doctor. Quitting will help your lungs heal faster.  Use an inhaler, cool mist vaporizer, or humidifier as told by your doctor.  Keep all follow-up visits as told by your doctor. This is important. How is this prevented? To lower your risk of getting this condition again:  Wash your hands often with soap and water. If you cannot use soap and water, use hand  sanitizer.  Avoid contact with people who have cold symptoms.  Try not to touch your hands to your mouth, nose, or eyes.  Make sure to get the flu shot every year. Contact a doctor if:  Your symptoms do not get better in 2 weeks. Get help right away if:  You cough up blood.  You have chest pain.  You have very bad shortness of breath.  You become dehydrated.  You faint (pass out) or keep feeling like you are going to pass out.  You keep throwing up (vomiting).  You have a very bad headache.  Your fever or chills gets worse. This information is not intended to replace advice given to you by your health care provider. Make sure you discuss any questions you have with your health care provider. Document Released: 04/12/2008 Document Revised: 06/02/2016 Document Reviewed: 04/14/2016 Elsevier Interactive Patient Education  2017 Elsevier Inc.      Edwina Barth, MD [facility]

## 2016-12-01 NOTE — Patient Instructions (Addendum)
     IF you received an x-ray today, you will receive an invoice from [facility]. Please contact [facility] at [phone] with questions or concerns regarding your invoice.   IF you received labwork today, you will receive an invoice from [facility]. Please contact [facility] at [phone] with questions or concerns regarding your invoice.   Our billing staff will not be able to assist you with questions regarding bills from these companies.  You will be contacted with the lab results as soon as they are available. The fastest way to get your results is to activate your My Chart account. Instructions are located on the last page of this paperwork. If you have not heard from us regarding the results in 2 weeks, please contact this office.      Acute Bronchitis, Adult Acute bronchitis is when air tubes (bronchi) in the lungs suddenly get swollen. The condition can make it hard to breathe. It can also cause these symptoms:  A cough.  Coughing up clear, yellow, or green mucus.  Wheezing.  Chest congestion.  Shortness of breath.  A fever.  Body aches.  Chills.  A sore throat.  Follow these instructions at home: Medicines  Take over-the-counter and prescription medicines only as told by your doctor.  If you were prescribed an antibiotic medicine, take it as told by your doctor. Do not stop taking the antibiotic even if you start to feel better. General instructions  Rest.  Drink enough fluids to keep your pee (urine) clear or pale yellow.  Avoid smoking and secondhand smoke. If you smoke and you need help quitting, ask your doctor. Quitting will help your lungs heal faster.  Use an inhaler, cool mist vaporizer, or humidifier as told by your doctor.  Keep all follow-up visits as told by your doctor. This is important. How is this prevented? To lower your risk of getting this condition again:  Wash your hands often with soap and water. If you cannot  use soap and water, use hand sanitizer.  Avoid contact with people who have cold symptoms.  Try not to touch your hands to your mouth, nose, or eyes.  Make sure to get the flu shot every year.  Contact a doctor if:  Your symptoms do not get better in 2 weeks. Get help right away if:  You cough up blood.  You have chest pain.  You have very bad shortness of breath.  You become dehydrated.  You faint (pass out) or keep feeling like you are going to pass out.  You keep throwing up (vomiting).  You have a very bad headache.  Your fever or chills gets worse. This information is not intended to replace advice given to you by your health care provider. Make sure you discuss any questions you have with your health care provider. Document Released: 04/12/2008 Document Revised: 06/02/2016 Document Reviewed: 04/14/2016 Elsevier Interactive Patient Education  2017 Elsevier Inc.  

## 2016-12-31 ENCOUNTER — Ambulatory Visit (INDEPENDENT_AMBULATORY_CARE_PROVIDER_SITE_OTHER): Payer: PRIVATE HEALTH INSURANCE | Admitting: Physician Assistant

## 2016-12-31 ENCOUNTER — Ambulatory Visit (INDEPENDENT_AMBULATORY_CARE_PROVIDER_SITE_OTHER): Payer: PRIVATE HEALTH INSURANCE

## 2016-12-31 VITALS — BP 120/96 | HR 85 | Temp 98.5°F | Resp 18 | Ht 65.0 in | Wt 221.0 lb

## 2016-12-31 DIAGNOSIS — R109 Unspecified abdominal pain: Secondary | ICD-10-CM

## 2016-12-31 DIAGNOSIS — K5900 Constipation, unspecified: Secondary | ICD-10-CM | POA: Diagnosis not present

## 2016-12-31 DIAGNOSIS — R1310 Dysphagia, unspecified: Secondary | ICD-10-CM | POA: Diagnosis not present

## 2016-12-31 DIAGNOSIS — R1319 Other dysphagia: Secondary | ICD-10-CM

## 2016-12-31 DIAGNOSIS — M109 Gout, unspecified: Secondary | ICD-10-CM | POA: Diagnosis not present

## 2016-12-31 MED ORDER — BISACODYL 10 MG RE SUPP: 10.0000 mg | RECTAL | 0 refills | Status: DC | PRN

## 2016-12-31 MED ORDER — BISACODYL 5 MG PO TBEC: 10.0000 mg | DELAYED_RELEASE_TABLET | Freq: Every day | ORAL | 1 refills | Status: DC | PRN

## 2016-12-31 MED ORDER — COLCHICINE 0.6 MG PO TABS: ORAL_TABLET | ORAL | 0 refills | Status: DC

## 2016-12-31 NOTE — Progress Notes (Addendum)
Richard Berger  MRN: [id number] DOB: 03/21/1976  PCP: No primary care provider on file.  Subjective:  Pt is a 41 year old OSA, Ed, gout, obesity, who presents to clinic for abdominal pain.   Food does not go all the way down x 1.5 weeks.  Feels like the food "gets stuck". Takes his time to chew. Stomach pain x 1.5 weeks. 5/10 pain. Hurts worse after he eats food. + headache  Last BM was last night. Endorses reduced amount of stool. Abdominal pain with defecating.  He eats rice and vegetables. No red meat. 2-3 beers on weekends.  He has not taken anything to feel better.  Denies nausea, vomiting, SOB, difficulty breathing, chest pain, cough. Admits to difficulty moving bowels - has taken Miralax in the past, this did not help him.    Gout flare. R ankle x 2 days. H/o gout. Taken colchicine in the past.   Review of Systems  Constitutional: Negative for chills, diaphoresis and fever.  Respiratory: Negative for cough, chest tightness, shortness of breath and wheezing.   Cardiovascular: Negative for chest pain and palpitations.  Gastrointestinal: Positive for abdominal pain. Negative for diarrhea, nausea and vomiting.  Musculoskeletal: Negative for neck pain.  Neurological: Positive for headaches. Negative for dizziness, syncope and light-headedness.  Psychiatric/Behavioral: Negative for sleep disturbance. The patient is not nervous/anxious.     Patient Active Problem List   Diagnosis Date Noted  . Acute bronchitis 12/01/2016  . Pleurisy 12/01/2016  . OSA (obstructive sleep apnea) 05/06/2016  . Erectile dysfunction 05/06/2016  . Morbid obesity (HCC) 05/02/2015  . Obesity 01/17/2015  . Hyperuricemia 06/27/2014  . 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 Gout 06/09/2012    Current Outpatient Prescriptions on File Prior to Visit  Medication Sig Dispense Refill  . colchicine (COLCRYS) 0.6 MG tablet TAKE 2 tablets at the first sign of flare, followed in 1 hour with 1 tablet. (Patient  not taking: Reported on 12/01/2016) 30 tablet 0  . tadalafil (CIALIS) 20 MG tablet Take 0.5-1 tablets (10-20 mg total) by mouth every other day as needed for erectile dysfunction. (Patient not taking: Reported on 10/06/2016) 5 tablet 5   No current facility-administered medications on file prior to visit.     No Known Allergies   Objective:  BP (!) 142/110   Pulse 85   Temp 98.5 F (36.9 C) (Oral)   Resp 18   Ht 5\' 5"  (1.651 m)   Wt 221 lb (100.2 kg)   SpO2 99%   BMI 36.78 kg/m   Physical Exam  Constitutional: He is oriented to person, place, and time and well-developed, well-nourished, and in no distress. No distress.  Cardiovascular: Normal rate, regular rhythm and normal heart sounds.   Abdominal: There is tenderness in the epigastric area and left upper quadrant.  Musculoskeletal:       Right ankle: He exhibits normal range of motion, no deformity and normal pulse.  R ankle is warm, swollen and TTP.   Neurological: He is alert and oriented to person, place, and time. GCS score is 15.  Skin: Skin is warm and dry.  Psychiatric: Mood, memory, affect and judgment normal.  Vitals reviewed.  Dg Abd 2 Views  Result Date: 12/31/2016 CLINICAL DATA:  Abdominal pain for 1 week EXAM: ABDOMEN - 2 VIEW COMPARISON:  August 10, 2012 FINDINGS: Supine and upright images were obtained. There is moderate stool throughout colon. There is no bowel dilatation or air-fluid level suggesting bowel obstruction. No free air.  There is a phlebolith in left pelvis. Lung bases are clear. IMPRESSION: Moderate stool throughout colon.  No bowel obstruction or free air. Electronically Signed   By: Bretta Bang III M.D.   On: 12/31/2016 11:42    Assessment and Plan :  1. Constipation, unspecified constipation type 2. Abdominal pain, unspecified abdominal location 3. Esophageal dysphagal - bisacodyl (DULCOLAX) 10 MG suppository; Place 1 suppository (10 mg total) rectally as needed for moderate  constipation.  Dispense: 12 suppository; Refill: 0 - bisacodyl (BISACODYL) 5 MG EC tablet; Take 2 tablets (10 mg total) by mouth daily as needed for moderate constipation.  Dispense: 30 tablet; Refill: 1 - H. pylori breath test - DG Abd 2 Views; Future - H Pylori pending. Suspect stool burden as the source of this pt's symptoms, as per abdominal x-ray. Will treat with Ducolax. Take 2 Dulcolax tablets now and repeat tonight. If no bowel movement he will take 2 Dulcolax in the morning and 2 tomorrow night. He is to use a Dulcolax suppository now repeat tonight and repeat in the morning and evening tomorrow if he does not get results. RTC if symptoms return/do not improve. Consider PPI treatment.  - Consider GI referral for possible endoscopy if dysphagia does not resolve.   4. Acute gout of right ankle, unspecified cause - colchicine (COLCRYS) 0.6 MG tablet; TAKE 2 tablets at the first sign of flare, followed in 1 hour with 1 tablet.  Dispense: 30 tablet; Refill: 0  Marco Collie, PA-C  Primary Care at [facility] 12/31/2016 10:44 AM

## 2016-12-31 NOTE — Patient Instructions (Addendum)
Take 2 Dulcolax tablets now and repeat tonight. If no bowel movement he will take 2 Dulcolax in the morning and 2 tomorrow night. He is to use a Dulcolax suppository now repeat tonight and repeat in the morning and evening tomorrow if he does not get results.   Please increase the amount of water you are drinking. Start exercising on a regular basis.  Increasing fiber intake, water and regular exercise helps prevent constipation. See below for more home care tips.   Constipation, Adult Constipation is when a person has fewer bowel movements in a week than normal, has difficulty having a bowel movement, or has stools that are dry, hard, or larger than normal. Constipation may be caused by an underlying condition. It may become worse with age if a person takes certain medicines and does not take in enough fluids. Follow these instructions at home: Eating and drinking  Eat foods that have a lot of fiber, such as fresh fruits and vegetables, whole grains, and beans.  Limit foods that are high in fat, low in fiber, or overly processed, such as french fries, hamburgers, cookies, candies, and soda.  Drink enough fluid to keep your urine clear or pale yellow. General instructions  Exercise regularly or as told by your health care provider.  Go to the restroom when you have the urge to go. Do not hold it in.  Take over-the-counter and prescription medicines only as told by your health care provider. These include any fiber supplements.  Practice pelvic floor retraining exercises, such as deep breathing while relaxing the lower abdomen and pelvic floor relaxation during bowel movements.  Watch your condition for any changes.  Keep all follow-up visits as told by your health care provider. This is important. Contact a health care provider if:  You have pain that gets worse.  You have a fever.  You do not have a bowel movement after 4 days.  You vomit.  You are not hungry.  You lose  weight.  You are bleeding from the anus.  You have thin, pencil-like stools. Get help right away if:  You have a fever and your symptoms suddenly get worse.  You leak stool or have blood in your stool.  Your abdomen is bloated.  You have severe pain in your abdomen.  You feel dizzy or you faint. This information is not intended to replace advice given to you by your health care provider. Make sure you discuss any questions you have with your health care provider. Document Released: 07/23/2004 Document Revised: 05/14/2016 Document Reviewed: 04/14/2016 Elsevier Interactive Patient Education  2017 [employer].   Thank you for coming in today. I hope you feel we met your needs.  Feel free to call [facility] if you have any questions or further requests.  Please consider signing up for MyChart if you do not already have it, as this is a great way to communicate with me.  Best,  Whitney McVey, PA-C   IF you received an x-ray today, you will receive an invoice from [facility]. Please contact [facility] at [phone] with questions or concerns regarding your invoice.   IF you received labwork today, you will receive an invoice from [facility]. Please contact [facility] at [phone] with questions or concerns regarding your invoice.   Our billing staff will not be able to assist you with questions regarding bills from these companies.  You will be contacted with the lab results as soon as they are available. The fastest way to  get your results is to activate your My Chart account. Instructions are located on the last page of this paperwork. If you have not heard from [place] regarding the results in 2 weeks, please contact this office.

## 2017-01-02 LAB — H. PYLORI BREATH TEST

## 2017-01-02 LAB — H.PYLORI BREATH TEST (REFLEX): H. pylori Breath Test: NEGATIVE

## 2017-01-03 ENCOUNTER — Encounter: Payer: Self-pay | Admitting: Physician Assistant

## 2017-01-03 NOTE — Progress Notes (Signed)
Negative H. Pylori. Letter sent to pt asking to RTC if symptoms persist. Consider PPI and/or GI referral for dysphasia work-up.

## 2017-05-06 ENCOUNTER — Encounter: Payer: PRIVATE HEALTH INSURANCE | Admitting: Family Medicine

## 2017-05-07 ENCOUNTER — Ambulatory Visit (INDEPENDENT_AMBULATORY_CARE_PROVIDER_SITE_OTHER): Payer: PRIVATE HEALTH INSURANCE | Admitting: Family Medicine

## 2017-05-07 ENCOUNTER — Encounter: Payer: Self-pay | Admitting: Family Medicine

## 2017-05-07 VITALS — BP 121/84 | HR 96 | Temp 97.9°F | Resp 16 | Ht 66.0 in | Wt 233.8 lb

## 2017-05-07 DIAGNOSIS — R3121 Asymptomatic microscopic hematuria: Secondary | ICD-10-CM

## 2017-05-07 DIAGNOSIS — M109 Gout, unspecified: Secondary | ICD-10-CM | POA: Diagnosis not present

## 2017-05-07 DIAGNOSIS — M10071 Idiopathic gout, right ankle and foot: Secondary | ICD-10-CM | POA: Diagnosis not present

## 2017-05-07 DIAGNOSIS — Z1383 Encounter for screening for respiratory disorder NEC: Secondary | ICD-10-CM

## 2017-05-07 DIAGNOSIS — Z1329 Encounter for screening for other suspected endocrine disorder: Secondary | ICD-10-CM

## 2017-05-07 DIAGNOSIS — E6609 Other obesity due to excess calories: Secondary | ICD-10-CM | POA: Diagnosis not present

## 2017-05-07 DIAGNOSIS — Z6837 Body mass index (BMI) 37.0-37.9, adult: Secondary | ICD-10-CM

## 2017-05-07 DIAGNOSIS — Z1389 Encounter for screening for other disorder: Secondary | ICD-10-CM | POA: Diagnosis not present

## 2017-05-07 DIAGNOSIS — Z130 Encounter for screening for diseases of the blood and blood-forming organs and certain disorders involving the immune mechanism: Secondary | ICD-10-CM

## 2017-05-07 DIAGNOSIS — Z947 Corneal transplant status: Secondary | ICD-10-CM

## 2017-05-07 DIAGNOSIS — Z136 Encounter for screening for cardiovascular disorders: Secondary | ICD-10-CM | POA: Diagnosis not present

## 2017-05-07 DIAGNOSIS — Z0000 Encounter for general adult medical examination without abnormal findings: Secondary | ICD-10-CM | POA: Diagnosis not present

## 2017-05-07 DIAGNOSIS — Z87898 Personal history of other specified conditions: Secondary | ICD-10-CM | POA: Diagnosis not present

## 2017-05-07 LAB — POCT URINALYSIS DIP (MANUAL ENTRY)
BILIRUBIN UA: NEGATIVE
Glucose, UA: NEGATIVE mg/dL
Ketones, POC UA: NEGATIVE mg/dL
LEUKOCYTES UA: NEGATIVE
NITRITE UA: NEGATIVE
PH UA: 6 (ref 5.0–8.0)
PROTEIN UA: NEGATIVE mg/dL
Spec Grav, UA: <=1.005 — AB (ref 1.010–1.025)
UROBILINOGEN UA: 0.2 U/dL

## 2017-05-07 MED ORDER — COLCHICINE 0.6 MG PO TABS: ORAL_TABLET | ORAL | 5 refills | Status: DC

## 2017-05-07 NOTE — Progress Notes (Addendum)
Subjective:  By signing my name below, I, Stann Oresung-Kai Tsai, attest that this documentation has been prepared under the direction and in the presence of Norberto SorensonEva Brittanya Winburn, MD. Electronically Signed: Stann Oresung-Kai Tsai, Scribe. 05/07/2017 , 8:44 AM .  Patient was seen in Room 1 .   Patient ID: Richard Berger, male    DOB: March 06, 1976, 41 y.o.   MRN: [id number] Chief Complaint  Patient presents with  . Annual Exam    gout in right foot    HPI Richard Berger is a 41 y.o. male who [facility] annual physical. He was seen 1 year prior for his physical with one of my colleagues. His routine labs last year were normal. He is fasting today.   Dizziness He reports feeling dizzy with blurry vision that started a week ago. He has an appointment next month with his ophthalmologist, Dr. Mckinley JewelSara Stoneburner. He denies feeling off-balance, room spinning, or lightheadedness.   Gout He feels there's a flare starting up. He hasn't taken any OTC medications for this. He was previously prescribed colchicine in his Feb visit. He usually takes 4 tablets of colchicine to prevent the gout flare up. He denies abdominal problems with colchicine. He reports rare gout flares.   OSA History of OSA diagnosed by sleep study. He refuses to use CPAP due to discomfort.  [facility] by Dr. Frances FurbishAthar summer 2015  ED He failed Viagra. He states the Cialis too expensive. He is in a monogamist relationship with a male. He denies any new partners in the past year. He denies any urinary symptoms. Therefore, declines STI screening as neg/nml last yr and no concern for exposure since.  Exercise His work requires a lot of physical activity.   Dentist He does not go to dentist regularly due to cost.   Vision He has history of cataracts removal. He does wear corrective glasses.   Diet He is a vegetarian.   Work He works in an Theatre stage managerassembly line, Art therapiststacking packages.   Social Rare use of social tobacco  and alcohol.   Immunization History  Administered Date(s) Administered  . Tdap 04/28/2014      Past Medical History:  Diagnosis Date  . Cataract   . Gout    Prior to Admission medications   Medication Sig Start Date End Date Taking? Authorizing Provider  bisacodyl (BISACODYL) 5 MG EC tablet Take 2 tablets (10 mg total) by mouth daily as needed for moderate constipation. 12/31/16   McVey, Madelaine BhatElizabeth Whitney, PA-C  bisacodyl (DULCOLAX) 10 MG suppository Place 1 suppository (10 mg total) rectally as needed for moderate constipation. 12/31/16   McVey, Madelaine BhatElizabeth Whitney, PA-C  colchicine (COLCRYS) 0.6 MG tablet TAKE 2 tablets at the first sign of flare, followed in 1 hour with 1 tablet. 12/31/16   McVey, Madelaine BhatElizabeth Whitney, PA-C  tadalafil (CIALIS) 20 MG tablet Take 0.5-1 tablets (10-20 mg total) by mouth every other day as needed for erectile dysfunction. Patient not taking: Reported on 10/06/2016 05/06/16   Dorna LeitzBush, Nicole V, PA-C   No Known Allergies   Past Surgical History:  Procedure Laterality Date  . EYE SURGERY Right    Cataract   History reviewed. No pertinent family history. Social History   Social History  . Marital status: Single    Spouse name: n/a  . Number of children: 1  . Years of education: 8   Occupational History  . 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  Smoking status: Current Some Day Smoker    Packs/day: 0.40    Years: 6.00    Types: Cigarettes  . Smokeless tobacco: Never Used  . Alcohol use 2.4 oz/week    2 Glasses of wine, 2 Cans of beer per week  . Drug use: No  . Sexual activity: No   Other Topics Concern  . None   Social History Narrative   Marital status: single; dating. Moved from [place] to [place] in 2001 with his father.  Family (1 brother and 1 sister) in [place]. Mother died there Feb 15, 2013.      Children: one son in [place].      Employment: employed      Tobacco: 1-2 cigarettes a week.      Alcohol:  alcohol on weekends.        Depression screen [facility] 2/9 05/07/2017 12/31/2016 12/01/2016 10/06/2016 05/06/2016  Decreased Interest 0 0 0 0 0  Down, Depressed, Hopeless - 0 0 0 0  PHQ - 2 Score 0 0 0 0 0    Review of Systems  Constitutional: Negative for fatigue and unexpected weight change.  Eyes: Positive for visual disturbance.  Respiratory: Negative for cough, chest tightness and shortness of breath.   Cardiovascular: Negative for chest pain, palpitations and leg swelling.  Gastrointestinal: Negative for abdominal pain and blood in stool.  Musculoskeletal: Positive for arthralgias.  Neurological: Positive for dizziness. Negative for light-headedness and headaches.  All other systems reviewed and are negative.      Objective:   Physical Exam  Constitutional: He is oriented to person, place, and time. He appears well-developed and well-nourished. No distress.  HENT:  Head: Normocephalic and atraumatic.  Right Ear: Tympanic membrane is scarred.  Left Ear: Tympanic membrane is erythematous and retracted.  Nose: Mucosal edema (L>R) present.  Mouth/Throat: Posterior oropharyngeal erythema present.  Eyes: EOM are normal. Pupils are equal, round, and reactive to light.  Neck: Neck supple. No thyromegaly present.  Cardiovascular: Normal rate, regular rhythm, S1 normal, S2 normal and normal heart sounds.  Exam reveals no gallop and no friction rub.   No murmur heard. Pulses:      Dorsalis pedis pulses are 2+ on the right side, and 2+ on the left side.  Bowel sounds heard in chest  Pulmonary/Chest: Effort normal and breath sounds normal. No respiratory distress.  Abdominal: Soft. Bowel sounds are normal. He exhibits no distension. There is no hepatosplenomegaly. There is no tenderness.  Musculoskeletal: Normal range of motion.  Right foot: hyperaesthetic lateral malleolus, no erythema or warmth  Lymphadenopathy:    He has no cervical adenopathy.       Right: No supraclavicular adenopathy  present.       Left: No supraclavicular adenopathy present.  Neurological: He is alert and oriented to person, place, and time.  Skin: Skin is warm and dry.  Psychiatric: He has a normal mood and affect. His behavior is normal.  Nursing note and vitals reviewed.   BP 121/84   Pulse 96   Temp 97.9 F (36.6 C) (Oral)   Resp 16   Ht 5\' 6"  (1.676 m)   Wt 233 lb 12.8 oz (106.1 kg)   SpO2 98%   BMI 37.74 kg/m    Wt Readings from Last 3 Encounters:  05/07/17 233 lb 12.8 oz (106.1 kg)  12/31/16 221 lb (100.2 kg)  12/01/16 228 lb (103.4 kg)       Assessment & Plan:   1. Annual physical  exam   2. Screening for cardiovascular, respiratory, and genitourinary diseases   3. Screening for deficiency anemia   4. Screening for thyroid disorder      6. History of elevated PSA - resolved on recheck last yr  7. Acute gout of right ankle, idiopathic - currently in flair but mild, out of colchicine for sev d, refilled. Declines daily prophylactic med as was on prior without sig improvement, aware of dietary triggers, and flairs have decreased in freq from prior.  8. Post corneal transplant - pt suspects this is the cause of the undescribable new short episodic "dizziness" reported in the ROS. Has f/u with optho sched.  9. Class 2 obesity due to excess calories without serious comorbidity with body mass index (BMI) of 37.0 to 37.9 in adult - stable -suspect weight at last visit was entered wrong as pt denies any fluctuations this yr.  10.    Asymptomatic microscopic hematuria-has been present on otherwise benign UAs for past 5 years at minimum but never confirmed on microscopy. Did temporarily resolve last year after antibiotic treatment for prostatitis (UClx and STD neg). If persists next year will send out microscopy and consider repeat urine culture, cytology, and or urology referral if microscopy confirms RBCs.    Orders Placed This Encounter  Procedures  . CBC with Differential/Platelet  .  Comprehensive metabolic panel    Order Specific Question:   Has the patient fasted?    Answer:   Yes  . Lipid panel    Order Specific Question:   Has the patient fasted?    Answer:   Yes  . TSH  . Uric acid  . Sedimentation Rate  . PSA  . Care order/instruction:    Scheduling Instructions:     Complete orders, AVS and go.  Marland Kitchen POCT urinalysis dipstick    Meds ordered this encounter  Medications  . colchicine (COLCRYS) 0.6 MG tablet    Sig: TAKE 2 tablets at the first sign of flare, followed in 1 hour with 1 tablet until symptoms resolve    Dispense:  30 tablet    Refill:  5    Brand only as stated by his insurance    I personally performed the services described in this documentation, which was scribed in my presence. The recorded information has been reviewed and considered, and addended by me as needed.   Norberto Sorenson, M.D.  Primary Care at [facility] [address] [place], [place] [address] [phone] phone [phone] fax  05/08/17 2:16 PM  Results for orders placed or performed in visit on 05/07/17  CBC with Differential/Platelet  Result Value Ref Range   WBC 8.0 3.4 - 10.8 x10E3/uL   RBC 5.40 4.14 - 5.80 x10E6/uL   Hemoglobin 15.9 13.0 - 17.7 g/dL   Hematocrit 13.0 86.5 - 51.0 %   MCV 87 79 - 97 fL   MCH 29.4 26.6 - 33.0 pg   MCHC 34.0 31.5 - 35.7 g/dL   RDW 78.4 69.6 - 29.5 %   Platelets 311 150 - 379 x10E3/uL   Neutrophils 51 Not Estab. %   Lymphs 36 Not Estab. %   Monocytes 11 Not Estab. %   Eos 2 Not Estab. %   Basos 0 Not Estab. %   Neutrophils Absolute 4.0 1.4 - 7.0 x10E3/uL   Lymphocytes Absolute 2.9 0.7 - 3.1 x10E3/uL   Monocytes Absolute 0.8 0.1 - 0.9 x10E3/uL   EOS (ABSOLUTE) 0.2 0.0 - 0.4 x10E3/uL  Basophils Absolute 0.0 0.0 - 0.2 x10E3/uL   Immature Granulocytes 0 Not Estab. %   Immature Grans (Abs) 0.0 0.0 - 0.1 x10E3/uL  Comprehensive metabolic panel  Result Value Ref Range   Glucose 97 65 - 99 mg/dL   BUN 15 6 - 20 mg/dL    Creatinine, Ser 1.61 0.76 - 1.27 mg/dL   GFR calc non Af Amer 100 >59 mL/min/1.73   GFR calc Af Amer 116 >59 mL/min/1.73   BUN/Creatinine Ratio 16 9 - 20   Sodium 139 134 - 144 mmol/L   Potassium 4.3 3.5 - 5.2 mmol/L   Chloride 103 96 - 106 mmol/L   CO2 21 20 - 29 mmol/L   Calcium 9.5 8.7 - 10.2 mg/dL   Total Protein 7.0 6.0 - 8.5 g/dL   Albumin 4.3 3.5 - 5.5 g/dL   Globulin, Total 2.7 1.5 - 4.5 g/dL   Albumin/Globulin Ratio 1.6 1.2 - 2.2   Bilirubin Total 0.2 0.0 - 1.2 mg/dL   Alkaline Phosphatase 82 39 - 117 IU/L   AST 35 0 - 40 IU/L   ALT 37 0 - 44 IU/L  Lipid panel  Result Value Ref Range   Cholesterol, Total 139 100 - 199 mg/dL   Triglycerides 096 (H) 0 - 149 mg/dL   HDL 43 >04 mg/dL   VLDL Cholesterol Cal 31 5 - 40 mg/dL   LDL Calculated 65 0 - 99 mg/dL   Chol/HDL Ratio 3.2 0.0 - 5.0 ratio  TSH  Result Value Ref Range   TSH 1.480 0.450 - 4.500 uIU/mL  Uric acid  Result Value Ref Range   Uric Acid 9.1 (H) 3.7 - 8.6 mg/dL  Sedimentation Rate  Result Value Ref Range   Sed Rate 3 0 - 15 mm/hr  PSA  Result Value Ref Range   Prostate Specific Ag, Serum WILL FOLLOW   POCT urinalysis dipstick  Result Value Ref Range   Color, UA yellow yellow   Clarity, UA clear clear   Glucose, UA negative negative mg/dL   Bilirubin, UA negative negative   Ketones, POC UA negative negative mg/dL   Spec Grav, UA <=5.409 (A) 1.010 - 1.025   Blood, UA small (A) negative   pH, UA 6.0 5.0 - 8.0   Protein Ur, POC negative negative mg/dL   Urobilinogen, UA 0.2 0.2 or 1.0 E.U./dL   Nitrite, UA Negative Negative   Leukocytes, UA Negative Negative

## 2017-05-07 NOTE — Patient Instructions (Addendum)
IF you received an x-ray today, you will receive an invoice from [facility]. Please contact [facility] at [phone] with questions or concerns regarding your invoice.   IF you received labwork today, you will receive an invoice from [place]. Please contact [facility] at [phone] with questions or concerns regarding your invoice.   Our billing staff will not be able to assist you with questions regarding bills from these companies.  You will be contacted with the lab results as soon as they are available. The fastest way to get your results is to activate your My Chart account. Instructions are located on the last page of this paperwork. If you have not heard from [place] regarding the results in 2 weeks, please contact this office.     Health Maintenance, Male A healthy lifestyle and preventive care is important for your health and wellness. Ask your health care provider about what schedule of regular examinations is right for you. What should I know about weight and diet? Eat a Healthy Diet  Eat plenty of vegetables, fruits, whole grains, low-fat dairy products, and lean protein.  Do not eat a lot of foods high in solid fats, added sugars, or salt.  Maintain a Healthy Weight Regular exercise can help you achieve or maintain a healthy weight. You should:  Do at least 150 minutes of exercise each week. The exercise should increase your heart rate and make you sweat (moderate-intensity exercise).  Do strength-training exercises at least twice a week.  Watch Your Levels of Cholesterol and Blood Lipids  Have your blood tested for lipids and cholesterol every 5 years starting at 39 years of age. If you are at high risk for heart disease, you should start having your blood tested when you are 39 years old. You may need to have your cholesterol levels checked more often if: ? Your lipid or cholesterol levels are high. ? You are older than 39 years of age. ? You are at  high risk for heart disease.  What should I know about cancer screening? Many types of cancers can be detected early and may often be prevented. Lung Cancer  You should be screened every year for lung cancer if: ? You are a current smoker who has smoked for at least 30 years. ? You are a former smoker who has quit within the past 15 years.  Talk to your health care provider about your screening options, when you should start screening, and how often you should be screened.  Colorectal Cancer  Routine colorectal cancer screening usually begins at 39 years of age and should be repeated every 5-10 years until you are 39 years old. You may need to be screened more often if early forms of precancerous polyps or small growths are found. Your health care provider may recommend screening at an earlier age if you have risk factors for colon cancer.  Your health care provider may recommend using home test kits to check for hidden blood in the stool.  A small camera at the end of a tube can be used to examine your colon (sigmoidoscopy or colonoscopy). This checks for the earliest forms of colorectal cancer.  Prostate and Testicular Cancer  Depending on your age and overall health, your health care provider may do certain tests to screen for prostate and testicular cancer.  Talk to your health care provider about any symptoms or concerns you have about testicular or prostate cancer.  Skin Cancer  Check your skin from head to  toe regularly.  Tell your health care provider about any new moles or changes in moles, especially if: ? There is a change in a mole's size, shape, or color. ? You have a mole that is larger than a pencil eraser.  Always use sunscreen. Apply sunscreen liberally and repeat throughout the day.  Protect yourself by wearing long sleeves, pants, a wide-brimmed hat, and sunglasses when outside.  What should I know about heart disease, diabetes, and high blood pressure?  If  you are 72-64 years of age, have your blood pressure checked every 3-5 years. If you are 77 years of age or older, have your blood pressure checked every year. You should have your blood pressure measured twice-once when you are at a hospital or clinic, and once when you are not at a hospital or clinic. Record the average of the two measurements. To check your blood pressure when you are not at a hospital or clinic, you can use: ? An automated blood pressure machine at a pharmacy. ? A home blood pressure monitor.  Talk to your health care provider about your target blood pressure.  If you are between 71-36 years old, ask your health care provider if you should take aspirin to prevent heart disease.  Have regular diabetes screenings by checking your fasting blood sugar level. ? If you are at a normal weight and have a low risk for diabetes, have this test once every three years after the age of 66. ? If you are overweight and have a high risk for diabetes, consider being tested at a younger age or more often.  A one-time screening for abdominal aortic aneurysm (AAA) by ultrasound is recommended for men aged 27-75 years who are current or former smokers. What should I know about preventing infection? Hepatitis B If you have a higher risk for hepatitis B, you should be screened for this virus. Talk with your health care provider to find out if you are at risk for hepatitis B infection. Hepatitis C Blood testing is recommended for:  Everyone born from [birth year] through 1963.  Anyone with known risk factors for hepatitis C.  Sexually Transmitted Diseases (STDs)  You should be screened each year for STDs including gonorrhea and chlamydia if: ? You are sexually active and are younger than 39 years of age. ? You are older than 39 years of age and your health care provider tells you that you are at risk for this type of infection. ? Your sexual activity has changed since you were last screened and you  are at an increased risk for chlamydia or gonorrhea. Ask your health care provider if you are at risk.  Talk with your health care provider about whether you are at high risk of being infected with HIV. Your health care provider may recommend a prescription medicine to help prevent HIV infection.  What else can I do?  Schedule regular health, dental, and eye exams.  Stay current with your vaccines (immunizations).  Do not use any tobacco products, such as cigarettes, chewing tobacco, and e-cigarettes. If you need help quitting, ask your health care provider.  Limit alcohol intake to no more than 2 drinks per day. One drink equals 12 ounces of beer, 5 ounces of wine, or 1 ounces of hard liquor.  Do not use street drugs.  Do not share needles.  Ask your health care provider for help if you need support or information about quitting drugs.  Tell your health care provider if you  often feel depressed.  Tell your health care provider if you have ever been abused or do not feel safe at home. This information is not intended to replace advice given to you by your health care provider. Make sure you discuss any questions you have with your health care provider. Document Released: 04/22/2008 Document Revised: 06/23/2016 Document Reviewed: 07/29/2015 Elsevier Interactive Patient Education  2018 [employer]. Low-Purine Diet Purines are compounds that affect the level of uric acid in your body. A low-purine diet is a diet that is low in purines. Eating a low-purine diet can prevent the level of uric acid in your body from getting too high and causing gout or kidney stones or both. What do I need to know about this diet?  Choose low-purine foods. Examples of low-purine foods are listed in the next section.  Drink plenty of fluids, especially water. Fluids can help remove uric acid from your body. Try to drink 8-16 cups (1.9-3.8 L) a day.  Limit foods high in fat, especially saturated fat, as  fat makes it harder for the body to get rid of uric acid. Foods high in saturated fat include pizza, cheese, ice cream, whole milk, fried foods, and gravies. Choose foods that are lower in fat and lean sources of protein. Use olive oil when cooking as it contains healthy fats that are not high in saturated fat.  Limit alcohol. Alcohol interferes with the elimination of uric acid from your body. If you are having a gout attack, avoid all alcohol.  Keep in mind that different people's bodies react differently to different foods. You will probably learn over time which foods do or do not affect you. If you discover that a food tends to cause your gout to flare up, avoid eating that food. You can more freely enjoy foods that do not cause problems. If you have any questions about a food item, talk to your dietitian or health care provider. Which foods are low, moderate, and high in purines? The following is a list of foods that are low, moderate, and high in purines. You can eat any amount of the foods that are low in purines. You may be able to have small amounts of foods that are moderate in purines. Ask your health care provider how much of a food moderate in purines you can have. Avoid foods high in purines. Grains  Foods low in purines: Enriched white bread, pasta, rice, cake, cornbread, popcorn.  Foods moderate in purines: Whole-grain breads and cereals, wheat germ, bran, oatmeal. Uncooked oatmeal. Dry wheat bran or wheat germ.  Foods high in purines: Pancakes, [place] toast, biscuits, muffins. Vegetables  Foods low in purines: All vegetables, except those that are moderate in purines.  Foods moderate in purines: Asparagus, cauliflower, spinach, mushrooms, green peas. Fruits  All fruits are low in purines. Meats and other Protein Foods  Foods low in purines: Eggs, nuts, peanut butter.  Foods moderate in purines: 80-90% lean beef, lamb, veal, pork, poultry, fish, eggs, peanut butter, nuts.  Crab, lobster, oysters, and shrimp. Cooked dried beans, peas, and lentils.  Foods high in purines: Anchovies, sardines, herring, mussels, tuna, codfish, scallops, trout, and haddock. Richard Berger. Organ meats (such as liver or kidney). Tripe. Game meat. Goose. Sweetbreads. Dairy  All dairy foods are low in purines. Low-fat and fat-free dairy products are best because they are low in saturated fat. Beverages  Drinks low in purines: Water, carbonated beverages, tea, coffee, cocoa.  Drinks moderate in purines: Soft drinks and  other drinks sweetened with high-fructose corn syrup. Juices. To find whether a food or drink is sweetened with high-fructose corn syrup, look at the ingredients list.  Drinks high in purines: Alcoholic beverages (such as beer). Condiments  Foods low in purines: Salt, herbs, olives, pickles, relishes, vinegar.  Foods moderate in purines: Butter, margarine, oils, mayonnaise. Fats and Oils  Foods low in purines: All types, except gravies and sauces made with meat.  Foods high in purines: Gravies and sauces made with meat. Other Foods  Foods low in purines: Sugars, sweets, gelatin. Cake. Soups made without meat.  Foods moderate in purines: Meat-based or fish-based soups, broths, or bouillons. Foods and drinks sweetened with high-fructose corn syrup.  Foods high in purines: High-fat desserts (such as ice cream, cookies, cakes, pies, doughnuts, and chocolate). Contact your dietitian for more information on foods that are not listed here. This information is not intended to replace advice given to you by your health care provider. Make sure you discuss any questions you have with your health care provider. Document Released: 02/19/2011 Document Revised: 04/01/2016 Document Reviewed: 10/01/2013 Elsevier Interactive Patient Education  2017 [employer]

## 2017-05-08 LAB — COMPREHENSIVE METABOLIC PANEL
ALT: 37 IU/L (ref 0–44)
AST: 35 IU/L (ref 0–40)
Albumin/Globulin Ratio: 1.6 (ref 1.2–2.2)
Albumin: 4.3 g/dL (ref 3.5–5.5)
Alkaline Phosphatase: 82 IU/L (ref 39–117)
BUN/Creatinine Ratio: 16 (ref 9–20)
BUN: 15 mg/dL (ref 6–20)
Bilirubin Total: 0.2 mg/dL (ref 0.0–1.2)
CALCIUM: 9.5 mg/dL (ref 8.7–10.2)
CO2: 21 mmol/L (ref 20–29)
CREATININE: 0.95 mg/dL (ref 0.76–1.27)
Chloride: 103 mmol/L (ref 96–106)
GFR, EST AFRICAN AMERICAN: 116 mL/min/{1.73_m2} (ref >59)
GFR, EST NON AFRICAN AMERICAN: 100 mL/min/{1.73_m2} (ref >59)
Globulin, Total: 2.7 g/dL (ref 1.5–4.5)
Glucose: 97 mg/dL (ref 65–99)
Potassium: 4.3 mmol/L (ref 3.5–5.2)
Sodium: 139 mmol/L (ref 134–144)
TOTAL PROTEIN: 7 g/dL (ref 6.0–8.5)

## 2017-05-08 LAB — CBC WITH DIFFERENTIAL/PLATELET
Basophils Absolute: 0 10*3/uL (ref 0.0–0.2)
Basos: 0 %
EOS (ABSOLUTE): 0.2 10*3/uL (ref 0.0–0.4)
EOS: 2 %
Hematocrit: 46.7 % (ref 37.5–51.0)
Hemoglobin: 15.9 g/dL (ref 13.0–17.7)
IMMATURE GRANS (ABS): 0 10*3/uL (ref 0.0–0.1)
IMMATURE GRANULOCYTES: 0 %
LYMPHS: 36 %
Lymphocytes Absolute: 2.9 10*3/uL (ref 0.7–3.1)
MCH: 29.4 pg (ref 26.6–33.0)
MCHC: 34 g/dL (ref 31.5–35.7)
MCV: 87 fL (ref 79–97)
MONOS ABS: 0.8 10*3/uL (ref 0.1–0.9)
Monocytes: 11 %
NEUTROS PCT: 51 %
Neutrophils Absolute: 4 10*3/uL (ref 1.4–7.0)
PLATELETS: 311 10*3/uL (ref 150–379)
RBC: 5.4 x10E6/uL (ref 4.14–5.80)
RDW: 14.5 % (ref 12.3–15.4)
WBC: 8 10*3/uL (ref 3.4–10.8)

## 2017-05-08 LAB — LIPID PANEL
CHOL/HDL RATIO: 3.2 ratio (ref 0.0–5.0)
Cholesterol, Total: 139 mg/dL (ref 100–199)
HDL: 43 mg/dL (ref >39)
LDL CALC: 65 mg/dL (ref 0–99)
TRIGLYCERIDES: 156 mg/dL — AB (ref 0–149)
VLDL CHOLESTEROL CAL: 31 mg/dL (ref 5–40)

## 2017-05-08 LAB — SEDIMENTATION RATE: Sed Rate: 3 mm/hr (ref 0–15)

## 2017-05-08 LAB — PSA: PROSTATE SPECIFIC AG, SERUM: 0.7 ng/mL (ref 0.0–4.0)

## 2017-05-08 LAB — TSH: TSH: 1.48 u[IU]/mL (ref 0.450–4.500)

## 2017-05-08 LAB — URIC ACID: Uric Acid: 9.1 mg/dL — ABNORMAL HIGH (ref 3.7–8.6)

## 2017-05-09 ENCOUNTER — Encounter: Payer: Self-pay | Admitting: *Deleted

## 2017-06-25 ENCOUNTER — Ambulatory Visit (INDEPENDENT_AMBULATORY_CARE_PROVIDER_SITE_OTHER): Payer: PRIVATE HEALTH INSURANCE | Admitting: Family Medicine

## 2017-06-25 ENCOUNTER — Encounter: Payer: Self-pay | Admitting: Family Medicine

## 2017-06-25 VITALS — BP 120/79 | HR 73 | Temp 98.2°F | Resp 16 | Ht 65.75 in | Wt 230.0 lb

## 2017-06-25 DIAGNOSIS — K5900 Constipation, unspecified: Secondary | ICD-10-CM | POA: Diagnosis not present

## 2017-06-25 DIAGNOSIS — M62830 Muscle spasm of back: Secondary | ICD-10-CM

## 2017-06-25 DIAGNOSIS — R109 Unspecified abdominal pain: Secondary | ICD-10-CM

## 2017-06-25 LAB — POC MICROSCOPIC URINALYSIS (UMFC): MUCUS RE: ABSENT

## 2017-06-25 LAB — POCT URINALYSIS DIP (MANUAL ENTRY)
BILIRUBIN UA: NEGATIVE
GLUCOSE UA: NEGATIVE mg/dL
Ketones, POC UA: NEGATIVE mg/dL
LEUKOCYTES UA: NEGATIVE
NITRITE UA: NEGATIVE
PH UA: 6 (ref 5.0–8.0)
Protein Ur, POC: NEGATIVE mg/dL
Spec Grav, UA: 1.01 (ref 1.010–1.025)
Urobilinogen, UA: 0.2 E.U./dL

## 2017-06-25 MED ORDER — CYCLOBENZAPRINE HCL 10 MG PO TABS: 10.0000 mg | ORAL_TABLET | Freq: Three times a day (TID) | ORAL | 1 refills | Status: DC | PRN

## 2017-06-25 MED ORDER — SILDENAFIL CITRATE 20 MG PO TABS: ORAL_TABLET | ORAL | 5 refills | Status: DC

## 2017-06-25 MED ORDER — MELOXICAM 15 MG PO TABS: 15.0000 mg | ORAL_TABLET | Freq: Every day | ORAL | 1 refills | Status: DC

## 2017-06-25 NOTE — Progress Notes (Signed)
By signing my name below, I, Richard Berger, attest that this documentation has been prepared under the direction and in the presence of Richard Sorenson, Richard Berger.  Electronically Signed: Arvilla Berger, Medical Scribe. 06/25/17. 11:22 AM.  Subjective:    Patient ID: Richard Berger, male    DOB: 06-06-1976, 41 y.o.   MRN: [id number]  HPI Chief Complaint  Patient presents with  . Back Pain    pt states he think he pulled a muscle 4 days ago     HPI Comments: Richard Berger is a 41 y.o. male who presents to Primary Care at [facility] complaining of right side back pain. Reports this occurs 1x a years, but this episode has had consistent pain. Pt has been taking goody powder BID for some relief of his sxs - has not tried stretches or hot/cold packs. He doesn't recall injuring his back and states he woke up with back pain. Reports his back is sore first thing in the morning, it feels tight, and it hurts to move/roll in the bed. Pain worsens with twisting and turning. Pt is right handed. Denies hematuria, abdominal pain, N/V, fever, sleep disturbance, and diaphoresis.  Gout: Denies recent flares.  Eye: Pt is taking 1 drop of prednisone a day for eye drops.  Constipation: Pt has been drinking green tea for relief of his sxs and mentions the medication given for it hasn't helped.  Erectile Dysfunction: Pt would like to get a "male medicine". I suspect he's referring to cialis or viagra.  Patient Active Problem List   Diagnosis Date Noted  . Acute bronchitis 12/01/2016  . Pleurisy 12/01/2016  . OSA (obstructive sleep apnea) 05/06/2016  . Erectile dysfunction 05/06/2016  . Morbid obesity (HCC) 05/02/2015  . Obesity 01/17/2015  . Hyperuricemia 06/27/2014  . 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 Gout 06/09/2012   Past Medical History:  Diagnosis Date  . Cataract   . Gout    Past Surgical History:  Procedure Laterality Date  . EYE SURGERY Right    Cataract   No Known Allergies Prior to Admission  medications   Medication Sig Start Date End Date Taking? Authorizing Provider  bisacodyl (BISACODYL) 5 MG EC tablet Take 2 tablets (10 mg total) by mouth daily as needed for moderate constipation. 12/31/16  Yes Richard Berger, Richard Bhat, Richard Berger  colchicine (COLCRYS) 0.6 MG tablet TAKE 2 tablets at the first sign of flare, followed in 1 hour with 1 tablet until symptoms resolve 05/07/17  Yes Richard Mocha, Richard Berger  History reviewed. No pertinent family history.  Social History   Social History  . Marital status: Single    Spouse name: n/a  . Number of children: 1  . Years of education: 8   Occupational History  . Investment banker, operational   Social History Main Topics  . Smoking status: Current Some Day Smoker    Packs/day: 0.40    Years: 6.00    Types: Cigarettes  . Smokeless tobacco: Never Used  . Alcohol use 2.4 oz/week    2 Glasses of wine, 2 Cans of beer per week  . Drug use: No  . Sexual activity: No   Other Topics Concern  . Not on file   Social History Narrative   Marital status: single; dating. Moved from [place] to [place] in 2001 with his father.  Family (1 brother and 1 sister) in [place]. Mother died there February 09, 2013.      Children: one son in [place].  Employment: employed      Tobacco: 1-2 cigarettes a week.      Alcohol: alcohol on weekends.        Depression screen [facility] 2/9 06/25/2017 05/07/2017 12/31/2016 12/01/2016 10/06/2016  Decreased Interest 0 0 0 0 0  Down, Depressed, Hopeless - - 0 0 0  PHQ - 2 Score 0 0 0 0 0    Review of Systems  Constitutional: Negative for diaphoresis and fever.  Gastrointestinal: Negative for abdominal pain, constipation, diarrhea, nausea and vomiting.  Genitourinary: Negative for hematuria.  Musculoskeletal: Positive for back pain. Negative for arthralgias and joint swelling.  Psychiatric/Behavioral: Negative for sleep disturbance.   Objective:  Physical Exam  Constitutional: He appears well-developed and  well-nourished. No distress.  HENT:  Head: Normocephalic and atraumatic.  Eyes: Conjunctivae are normal.  Neck: Neck supple.  Cardiovascular: Normal rate, regular rhythm, S1 normal, S2 normal and normal heart sounds.  Exam reveals no gallop and no friction rub.   No murmur heard. Pulmonary/Chest: Effort normal and breath sounds normal. No respiratory distress. He has no decreased breath sounds. He has no wheezes. He has no rhonchi. He has no rales.  Abdominal: There is CVA tenderness (Mild right).  Musculoskeletal:  No sinus process tenderness throughout thoracic and lumbar No paraspinal tenderness Trigger point in mid right rhomboid radiating lower  Neurological: He is alert.  Reflex Scores:      Patellar reflexes are 2+ on the right side and 2+ on the left side.      Achilles reflexes are 2+ on the right side and 2+ on the left side. Skin: Skin is warm and dry.  Psychiatric: He has a normal mood and affect. His behavior is normal.  Nursing note and vitals reviewed.   Vitals:   06/25/17 1059  BP: 120/79  Pulse: 73  Resp: 16  Temp: 98.2 F (36.8 C)  TempSrc: Oral  SpO2: 98%  Weight: 230 lb (104.3 kg)  Height: 5' 5.75" (1.67 m)   Body mass index is 37.41 kg/m.   Results for orders placed or performed in visit on 06/25/17  POCT urinalysis dipstick  Result Value Ref Range   Color, UA yellow yellow   Clarity, UA clear clear   Glucose, UA negative negative mg/dL   Bilirubin, UA negative negative   Ketones, POC UA negative negative mg/dL   Spec Grav, UA 0.677 0.340 - 1.025   Blood, UA small (A) negative   pH, UA 6.0 5.0 - 8.0   Protein Ur, POC negative negative mg/dL   Urobilinogen, UA 0.2 0.2 or 1.0 E.U./dL   Nitrite, UA Negative Negative   Leukocytes, UA Negative Negative   Assessment & Plan:   1. Acute right flank pain   2. Constipation, unspecified constipation type   3. Muscle spasm of back     Orders Placed This Encounter  Procedures  . POCT urinalysis  dipstick  . POCT Microscopic Urinalysis ([facility])    Meds ordered this encounter  Medications  . prednisoLONE acetate (PRED FORTE) 1 % ophthalmic suspension    Sig: INSTILL 1 DROP INTO THE RIGHT EYE DAILY    Refill:  1  . meloxicam (MOBIC) 15 MG tablet    Sig: Take 1 tablet (15 mg total) by mouth daily. For back pain    Dispense:  30 tablet    Refill:  1  . cyclobenzaprine (FLEXERIL) 10 MG tablet    Sig: Take 1 tablet (10 mg total) by mouth 3 (three) times daily as  needed for muscle spasms. For 2d, then every night for back muscle spasm    Dispense:  30 tablet    Refill:  1  . sildenafil (REVATIO) 20 MG tablet    Sig: Take 2-3 tabs po 1 hr prior to the onset of sexual activity    Dispense:  30 tablet    Refill:  5    I personally performed the services described in this documentation, which was scribed in my presence. The recorded information has been reviewed and considered, and addended by me as needed.   Richard Berger, M.D.  Primary Care at [facility] [address] [place], [place] [address] [phone] phone [phone] fax  06/27/17 11:42 PM

## 2017-06-25 NOTE — Patient Instructions (Addendum)
Take the meloxicam every morning for 3-6 weeks until pain is resolved. Take the cyclobenzaprine 3 times today and tomorrow then every night. Heat for 15 to 20 minutes every 2 hours today and tomorrow followed by stretching. Then immediately after work and again before bed.    IF you received an x-ray today, you will receive an invoice from [facility]. Please contact [facility] at [phone] with questions or concerns regarding your invoice.   IF you received labwork today, you will receive an invoice from [place]. Please contact [facility] at [phone] with questions or concerns regarding your invoice.   Our billing staff will not be able to assist you with questions regarding bills from these companies.  You will be contacted with the lab results as soon as they are available. The fastest way to get your results is to activate your My Chart account. Instructions are located on the last page of this paperwork. If you have not heard from [place] regarding the results in 2 weeks, please contact this office.      Back Exercises The following exercises strengthen the muscles that help to support the back. They also help to keep the lower back flexible. Doing these exercises can help to prevent back pain or lessen existing pain. If you have back pain or discomfort, try doing these exercises 2-3 times each day or as told by your health care provider. When the pain goes away, do them once each day, but increase the number of times that you repeat the steps for each exercise (do more repetitions). If you do not have back pain or discomfort, do these exercises once each day or as told by your health care provider. Exercises Single Knee to Chest  Repeat these steps 3-5 times for each leg: 1. Lie on your back on a firm bed or the floor with your legs extended. 2. Bring one knee to your chest. Your other leg should stay extended and in contact with the floor. 3. Hold your knee in  place by grabbing your knee or thigh. 4. Pull on your knee until you feel a gentle stretch in your lower back. 5. Hold the stretch for 10-30 seconds. 6. Slowly release and straighten your leg.  Pelvic Tilt  Repeat these steps 5-10 times: 1. Lie on your back on a firm bed or the floor with your legs extended. 2. Bend your knees so they are pointing toward the ceiling and your feet are flat on the floor. 3. Tighten your lower abdominal muscles to press your lower back against the floor. This motion will tilt your pelvis so your tailbone points up toward the ceiling instead of pointing to your feet or the floor. 4. With gentle tension and even breathing, hold this position for 5-10 seconds.  Cat-Cow  Repeat these steps until your lower back becomes more flexible: 1. Get into a hands-and-knees position on a firm surface. Keep your hands under your shoulders, and keep your knees under your hips. You may place padding under your knees for comfort. 2. Let your head hang down, and point your tailbone toward the floor so your lower back becomes rounded like the back of a cat. 3. Hold this position for 5 seconds. 4. Slowly lift your head and point your tailbone up toward the ceiling so your back forms a sagging arch like the back of a cow. 5. Hold this position for 5 seconds.  Press-Ups  Repeat these steps 5-10 times: 1. Lie on your abdomen (face-down)  on the floor. 2. Place your palms near your head, about shoulder-width apart. 3. While you keep your back as relaxed as possible and keep your hips on the floor, slowly straighten your arms to raise the top half of your body and lift your shoulders. Do not use your back muscles to raise your upper torso. You may adjust the placement of your hands to make yourself more comfortable. 4. Hold this position for 5 seconds while you keep your back relaxed. 5. Slowly return to lying flat on the floor.  Bridges  Repeat these steps 10 times: 1. Lie on  your back on a firm surface. 2. Bend your knees so they are pointing toward the ceiling and your feet are flat on the floor. 3. Tighten your buttocks muscles and lift your buttocks off of the floor until your waist is at almost the same height as your knees. You should feel the muscles working in your buttocks and the back of your thighs. If you do not feel these muscles, slide your feet 1-2 inches farther away from your buttocks. 4. Hold this position for 3-5 seconds. 5. Slowly lower your hips to the starting position, and allow your buttocks muscles to relax completely.  If this exercise is too easy, try doing it with your arms crossed over your chest. Abdominal Crunches  Repeat these steps 5-10 times: 1. Lie on your back on a firm bed or the floor with your legs extended. 2. Bend your knees so they are pointing toward the ceiling and your feet are flat on the floor. 3. Cross your arms over your chest. 4. Tip your chin slightly toward your chest without bending your neck. 5. Tighten your abdominal muscles and slowly raise your trunk (torso) high enough to lift your shoulder blades a tiny bit off of the floor. Avoid raising your torso higher than that, because it can put too much stress on your low back and it does not help to strengthen your abdominal muscles. 6. Slowly return to your starting position.  Back Lifts Repeat these steps 5-10 times: 1. Lie on your abdomen (face-down) with your arms at your sides, and rest your forehead on the floor. 2. Tighten the muscles in your legs and your buttocks. 3. Slowly lift your chest off of the floor while you keep your hips pressed to the floor. Keep the back of your head in line with the curve in your back. Your eyes should be looking at the floor. 4. Hold this position for 3-5 seconds. 5. Slowly return to your starting position.  Contact a health care provider if:  Your back pain or discomfort gets much worse when you do an exercise.  Your  back pain or discomfort does not lessen within 2 hours after you exercise. If you have any of these problems, stop doing these exercises right away. Do not do them again unless your health care provider says that you can. Get help right away if:  You develop sudden, severe back pain. If this happens, stop doing the exercises right away. Do not do them again unless your health care provider says that you can. This information is not intended to replace advice given to you by your health care provider. Make sure you discuss any questions you have with your health care provider. Document Released: 12/02/2004 Document Revised: 03/03/2016 Document Reviewed: 12/19/2014 Elsevier Interactive Patient Education  2017 [employer].

## 2017-08-15 ENCOUNTER — Ambulatory Visit (INDEPENDENT_AMBULATORY_CARE_PROVIDER_SITE_OTHER): Payer: PRIVATE HEALTH INSURANCE | Admitting: Family Medicine

## 2017-08-15 ENCOUNTER — Encounter: Payer: Self-pay | Admitting: Family Medicine

## 2017-08-15 VITALS — BP 124/68 | HR 86 | Temp 98.4°F | Resp 16 | Ht 67.0 in | Wt 238.0 lb

## 2017-08-15 DIAGNOSIS — M79622 Pain in left upper arm: Secondary | ICD-10-CM

## 2017-08-15 DIAGNOSIS — M791 Myalgia, unspecified site: Secondary | ICD-10-CM

## 2017-08-15 NOTE — Progress Notes (Signed)
Subjective:  By signing my name below, I, [name], attest that this documentation has been prepared under the direction and in the presence of [name], MD. Electronically Signed: [name], Scribe. 08/15/2017 , 11:35 AM .  Patient was seen in Room 12 .   Patient ID: Richard Berger, male    DOB: 19-Nov-1975, 41 y.o.   MRN: [id number] Chief Complaint  Patient presents with  . Hand Pain    x 1 wk/ left  . Arm Pain    x 1 wk/left   HPI Richard Berger is a 41 y.o. male  Here for left arm pain and left hand pain. He has a history of gout, and treated with colcrys previously. He complains left arm pain that started 9 days ago. He denies any known injury or falls. He has some neck pain and into his left shoulder as well. He's tried taking some meloxicam and flexeril from previous visit every few days, but stopped without relief. He denies any urinary symptoms. He had some improvement with relaxing and rest over the weekend.   He works as a Geographical information systems officer. He denies any new shifts, as he's done the same work or any changes. He is right hand dominant.   Patient Active Problem List   Diagnosis Date Noted  . Acute bronchitis 12/01/2016  . Pleurisy 12/01/2016  . OSA (obstructive sleep apnea) 05/06/2016  . Erectile dysfunction 05/06/2016  . Morbid obesity (HCC) 05/02/2015  . Obesity 01/17/2015  . Hyperuricemia 06/27/2014  . 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 Gout 06/09/2012   Past Medical History:  Diagnosis Date  . Cataract   . Gout    Past Surgical History:  Procedure Laterality Date  . EYE SURGERY Right    Cataract   No Known Allergies Prior to Admission medications   Medication Sig Start Date End Date Taking? Authorizing Provider  cyclobenzaprine (FLEXERIL) 10 MG tablet Take 1 tablet (10 mg total) by mouth 3 (three) times daily as needed for muscle spasms. For 2d, then every night for back muscle spasm 06/25/17  Yes Sherren Mocha, MD  meloxicam (MOBIC) 15 MG tablet  Take 1 tablet (15 mg total) by mouth daily. For back pain 06/25/17  Yes Sherren Mocha, MD  bisacodyl (BISACODYL) 5 MG EC tablet Take 2 tablets (10 mg total) by mouth daily as needed for moderate constipation. Patient not taking: Reported on 08/15/2017 12/31/16   McVey, Madelaine Bhat, PA-C  colchicine (COLCRYS) 0.6 MG tablet TAKE 2 tablets at the first sign of flare, followed in 1 hour with 1 tablet until symptoms resolve Patient not taking: Reported on 08/15/2017 05/07/17   Sherren Mocha, MD  prednisoLONE acetate (PRED FORTE) 1 % ophthalmic suspension INSTILL 1 DROP INTO THE RIGHT EYE DAILY 05/18/17   [provider]  sildenafil (REVATIO) 20 MG tablet Take 2-3 tabs po 1 hr prior to the onset of sexual activity Patient not taking: Reported on 08/15/2017 06/25/17   Sherren Mocha, MD   Social History   Social History  . Marital status: Single    Spouse name: n/a  . Number of children: 1  . Years of education: 8   Occupational History  . Investment banker, operational   Social History Main Topics  . Smoking status: Current Some Day Smoker    Packs/day: 0.40    Years: 6.00    Types: Cigarettes  . Smokeless tobacco: Never Used  . Alcohol use 2.4 oz/week  2 Glasses of wine, 2 Cans of beer per week  . Drug use: No  . Sexual activity: No   Other Topics Concern  . Not on file   Social History Narrative   Marital status: single; dating. Moved from [place] to [place] in 2001 with his father.  Family (1 brother and 1 sister) in [place]. Mother died there February 13, 2013.      Children: one son in [place].      Employment: employed      Tobacco: 1-2 cigarettes a week.      Alcohol: alcohol on weekends.        Review of Systems  Constitutional: Negative for fatigue and unexpected weight change.  Eyes: Negative for visual disturbance.  Respiratory: Negative for cough, chest tightness and shortness of breath.   Cardiovascular: Negative for chest pain, palpitations and leg  swelling.  Gastrointestinal: Negative for abdominal pain and blood in stool.  Musculoskeletal: Positive for arthralgias, myalgias and neck pain.  Skin: Negative for rash and wound.  Neurological: Negative for dizziness, weakness, light-headedness, numbness and headaches.       Objective:   Physical Exam  Constitutional: He is oriented to person, place, and time. He appears well-developed and well-nourished. No distress.  HENT:  Head: Normocephalic and atraumatic.  Eyes: Pupils are equal, round, and reactive to light. EOM are normal.  Neck: Neck supple.  Cardiovascular: Normal rate.   Pulses:      Radial pulses are 2+ on the left side.  Pulmonary/Chest: Effort normal. No respiratory distress.  Musculoskeletal: Normal range of motion.  Left elbow: pain free ROM Left forearm: non tender Wrist and hand: non tender, pain free ROM Left shoulder: pain free ROM in shoulder, but does have upper left arm pain with shoulder ROM Has some tenderness across biceps and triceps, no focal bony tenderness; no arm swelling or apparent vein swelling; resisted tricep and bicep strength intact, minimal discomfort, skin intact, no rash, no erythema; cap refills <1 second C-spine: pain free ROM  Neurological: He is alert and oriented to person, place, and time.  Skin: Skin is warm and dry.  Psychiatric: He has a normal mood and affect. His behavior is normal.  Nursing note and vitals reviewed.   Vitals:   08/15/17 1048  BP: 124/68  Pulse: 86  Resp: 16  Temp: 98.4 F (36.9 C)  TempSrc: Oral  SpO2: 99%  Weight: 238 lb (108 kg)  Height:  (1.702 m)      Assessment & Plan:  Richard Berger is a 41 y.o. male Left upper arm pain - Plan: CK Myalgia  - No focal bony tenderness, no known injury, imaging was deferred. Based on his type of job and the location of pain, myalgia/myositis was discussed, or potentially rhabdomyolysis. No other symptoms at present besides his left arm pain.  -Discussed  rest of affected extremity and work note provided if needed.  -CPK level was obtained that was noted to be elevated.  Called on October 9th with results and plan for continued rest of that arm and recheck levels in 1-2 days.  Results for orders placed or performed in visit on 08/15/17  CK  Result Value Ref Range   Total CK 593 (HH) 24 - 204 U/L     No orders of the defined types were placed in this encounter.  Patient Instructions    Okay to continue meloxicam once per day, add Tylenol as needed up to 4 times per  day. Decrease use of left arm, rest it as much as possible the next few days. I provided a note for work to help if needed. I will check a muscle enzyme test, and will let you know if that is significantly elevated. Recheck in 3 days, sooner if worsening.   IF you received an x-ray today, you will receive an invoice from [facility]. Please contact [facility] at [phone] with questions or concerns regarding your invoice.   IF you received labwork today, you will receive an invoice from [place]. Please contact [facility] at [phone] with questions or concerns regarding your invoice.   Our billing staff will not be able to assist you with questions regarding bills from these companies.  You will be contacted with the lab results as soon as they are available. The fastest way to get your results is to activate your My Chart account. Instructions are located on the last page of this paperwork. If you have not heard from [place] regarding the results in 2 weeks, please contact this office.       I personally performed the services described in this documentation, which was scribed in my presence. The recorded information has been reviewed and considered for accuracy and completeness, addended by me as needed, and agree with information above.  Signed,   [name], MD Primary Care at [facility].  08/17/17 10:47 PM

## 2017-08-15 NOTE — Patient Instructions (Addendum)
  Okay to continue meloxicam once per day, add Tylenol as needed up to 4 times per day. Decrease use of left arm, rest it as much as possible the next few days. I provided a note for work to help if needed. I will check a muscle enzyme test, and will let you know if that is significantly elevated. Recheck in 3 days, sooner if worsening.   IF you received an x-ray today, you will receive an invoice from [facility]. Please contact [facility] at [phone] with questions or concerns regarding your invoice.   IF you received labwork today, you will receive an invoice from [place]. Please contact [facility] at [phone] with questions or concerns regarding your invoice.   Our billing staff will not be able to assist you with questions regarding bills from these companies.  You will be contacted with the lab results as soon as they are available. The fastest way to get your results is to activate your My Chart account. Instructions are located on the last page of this paperwork. If you have not heard from [place] regarding the results in 2 weeks, please contact this office.

## 2017-08-16 LAB — CK: Total CK: 593 U/L (ref 24–204)

## 2017-08-22 ENCOUNTER — Ambulatory Visit: Payer: PRIVATE HEALTH INSURANCE | Admitting: Family Medicine

## 2017-08-23 ENCOUNTER — Encounter: Payer: Self-pay | Admitting: Family Medicine

## 2017-08-23 ENCOUNTER — Ambulatory Visit: Payer: PRIVATE HEALTH INSURANCE | Admitting: Family Medicine

## 2017-08-23 ENCOUNTER — Ambulatory Visit (INDEPENDENT_AMBULATORY_CARE_PROVIDER_SITE_OTHER): Payer: PRIVATE HEALTH INSURANCE

## 2017-08-23 VITALS — BP 132/91 | HR 84 | Temp 98.5°F | Resp 18 | Ht 67.0 in | Wt 235.8 lb

## 2017-08-23 DIAGNOSIS — M79622 Pain in left upper arm: Secondary | ICD-10-CM | POA: Diagnosis not present

## 2017-08-23 DIAGNOSIS — R748 Abnormal levels of other serum enzymes: Secondary | ICD-10-CM

## 2017-08-23 NOTE — Patient Instructions (Addendum)
  Tylenol up to 4 times per day for soreness, but would also recommend against frequent use of that arm.  Let me know if I can provide another work note. I will recheck muscle enzyme test.  Depending on that result, will likely refer you to orthopaedics.   Return to the clinic or go to the nearest emergency room if any of your symptoms worsen or new symptoms occur.   IF you received an x-ray today, you will receive an invoice from [facility]. Please contact [facility] at [phone] with questions or concerns regarding your invoice.   IF you received labwork today, you will receive an invoice from [place]. Please contact [facility] at [phone] with questions or concerns regarding your invoice.   Our billing staff will not be able to assist you with questions regarding bills from these companies.  You will be contacted with the lab results as soon as they are available. The fastest way to get your results is to activate your My Chart account. Instructions are located on the last page of this paperwork. If you have not heard from [place] regarding the results in 2 weeks, please contact this office.

## 2017-08-23 NOTE — Progress Notes (Addendum)
Subjective:  By signing my name below, I, Richard Berger, attest that this documentation has been prepared under the direction and in the presence of Shade Flood, MD Electronically Signed: Charline Bills, ED Scribe 08/23/2017 at 11:07 AM.   Patient ID: Richard Berger, male    DOB: 1976/10/21, 41 y.o.   MRN: [id number]  Chief Complaint  Patient presents with  . Arm Pain    left arm; states his arm still hurts  . Follow-up   HPI Richard Berger is a 41 y.o. male who presents to Primary Care at [facility] for follow-up of L upper arm pain. Initially seen 10/8. No known injury but pain starting 9 days prior. Slight pain into shoulder and neck. Had tried meloxicam and flexeril with minimal relief. Rest did improve symptoms. Pt works as a Geographical information systems officer. Denies any change in amount or type of work last visit. Did have a elevated CPK at 593 and advised to continue rest with repeat level in 1-2 days. This is his first visit back since 8 days ago.   Pt states that he returned to work the following day but was able to rest his L arm when his job changed him to a  position where he used only his R arm. However, he returned to his normal position after 3 days and noticed only minimal relief after rest. At this time, pt is only taking Tylenol. Denies dark urine, cramping, any other myalgias.   Patient Active Problem List   Diagnosis Date Noted  . Acute bronchitis 12/01/2016  . Pleurisy 12/01/2016  . OSA (obstructive sleep apnea) 05/06/2016  . Erectile dysfunction 05/06/2016  . Morbid obesity (HCC) 05/02/2015  . Obesity 01/17/2015  . Hyperuricemia 06/27/2014  . 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 Gout 06/09/2012   Past Medical History:  Diagnosis Date  . Cataract   . Gout    Past Surgical History:  Procedure Laterality Date  . EYE SURGERY Right    Cataract   No Known Allergies Prior to Admission medications   Medication Sig Start Date End Date Taking? Authorizing Provider  cyclobenzaprine  (FLEXERIL) 10 MG tablet Take 1 tablet (10 mg total) by mouth 3 (three) times daily as needed for muscle spasms. For 2d, then every night for back muscle spasm Patient not taking: Reported on 08/23/2017 06/25/17   Sherren Mocha, MD  meloxicam (MOBIC) 15 MG tablet Take 1 tablet (15 mg total) by mouth daily. For back pain Patient not taking: Reported on 08/23/2017 06/25/17   Sherren Mocha, MD   Social History   Social History  . Marital status: Single    Spouse name: n/a  . Number of children: 1  . Years of education: 8   Occupational History  . Investment banker, operational   Social History Main Topics  . Smoking status: Current Some Day Smoker    Packs/day: 0.40    Years: 6.00    Types: Cigarettes  . Smokeless tobacco: Never Used  . Alcohol use 2.4 oz/week    2 Glasses of wine, 2 Cans of beer per week  . Drug use: No  . Sexual activity: No   Other Topics Concern  . Not on file   Social History Narrative   Marital status: single; dating. Moved from [place] to [place] in 2001 with his father.  Family (1 brother and 1 sister) in [place]. Mother died there 02-05-2013.      Children: one son in [place].      Employment: employed      Tobacco: 1-2 cigarettes a week.      Alcohol: alcohol on weekends.        Review of Systems  Musculoskeletal: Positive for myalgias (L arm).      Objective:   Physical Exam  Constitutional: He is oriented to person, place, and time. He appears well-developed and well-nourished. No distress.  HENT:  Head: Normocephalic and atraumatic.  Eyes: Conjunctivae and EOM are normal.  Neck: Neck supple. No tracheal deviation present.  Cardiovascular: Normal rate.   Pulses:      Radial pulses are 2+ on the left side.  Pulmonary/Chest: Effort normal. No respiratory distress.  Musculoskeletal: Normal range of motion.  L arm: no pain with resisted biceps testing but tender along biceps diffusely. Minimal tenderness along triceps and minimal  discomfort with resisted triceps testing. Neck and trapezius nontender. Dorsal shoulder nontender. Pain free ROM of shoulder. Still has some discomfort in biceps with internal rotation. Pain with resisted supination of L arm. Hand veins are flat. Radial pulse 2+. No hand or finger swelling.   Neurological: He is alert and oriented to person, place, and time.  Skin: Skin is warm and dry.  Psychiatric: He has a normal mood and affect. His behavior is normal.  Nursing note and vitals reviewed.  Vitals:   08/23/17 1038 08/23/17 1101  BP: (!) 132/100 (!) 132/91  Pulse: 84   Resp: 18   Temp: 98.5 F (36.9 C)   TempSrc: Oral   SpO2: 99%   Weight: 235 lb 12.8 oz (107 kg)   Height:  (1.702 m)    Dg Humerus Left  Result Date: 08/23/2017 CLINICAL DATA:  Left upper arm pain, no injury EXAM: LEFT HUMERUS - 2+ VIEW COMPARISON:  None. FINDINGS: The left humerus is intact and normally aligned. No acute abnormality is seen. No significant degenerative change is evident. IMPRESSION: Negative. Electronically Signed   By: Dwyane Dee M.D.   On: 08/23/2017 11:51        Assessment & Plan:  [name] is a 41 y.o. male Left upper arm pain - Plan: CK  Elevated CPK - Plan: CK Suspected overuse vs less likely rhabdo, but did have elevated CPK in 500's last visit. Denies injury, no apparent deficit of biceps/triceps. No distal swelling. No apparent bony ttp. XR reassuring.   - repeat CPK, relative rest discussed, offered work note, but declined.   - depending on CPK, may have him see ortho to decide next step or to decide if more advanced imaging needed.    No orders of the defined types were placed in this encounter.  Patient Instructions    Tylenol up to 4 times per day for soreness, but would also recommend against frequent use of that arm.  Let me know if I can provide another work note. I will recheck muscle enzyme test.  Depending on that result, will likely refer you to orthopaedics.    Return to the clinic or go to the nearest emergency room if any of your symptoms worsen or new symptoms occur.    IF you received an x-ray today, you will receive an invoice from [facility]. Please contact [facility] at [phone] with questions or concerns regarding your invoice.   IF you received labwork today, you will receive an invoice from [place]. Please contact [facility] at [phone] with questions or concerns regarding your invoice.   Our billing staff will not  be able to assist you with questions regarding bills from these companies.  You will be contacted with the lab results as soon as they are available. The fastest way to get your results is to activate your My Chart account. Instructions are located on the last page of this paperwork. If you have not heard from [place] regarding the results in 2 weeks, please contact this office.       I personally performed the services described in this documentation, which was scribed in my presence. The recorded information has been reviewed and considered for accuracy and completeness, addended by me as needed, and agree with information above.  Signed,   [name], MD Primary Care at [facility].  08/23/17 11:22 AM

## 2017-08-24 LAB — CK: CK TOTAL: 142 U/L (ref 24–204)

## 2017-08-26 ENCOUNTER — Telehealth: Payer: Self-pay | Admitting: Family Medicine

## 2017-08-26 NOTE — Telephone Encounter (Signed)
PT CALLED ABOUT LAB RESULTS I GAVE HIM RESULTS AND MESSAGE DR Neva SeatGREENE LEFT PT UNDERSTANDS AND HAVE NO QUESTIONS

## 2017-12-15 ENCOUNTER — Ambulatory Visit: Payer: PRIVATE HEALTH INSURANCE | Admitting: Physician Assistant

## 2017-12-16 ENCOUNTER — Ambulatory Visit: Payer: PRIVATE HEALTH INSURANCE | Admitting: Urgent Care

## 2018-05-20 ENCOUNTER — Encounter: Payer: Self-pay | Admitting: Urgent Care

## 2018-05-20 ENCOUNTER — Ambulatory Visit: Payer: PRIVATE HEALTH INSURANCE | Admitting: Urgent Care

## 2018-05-20 VITALS — BP 102/67 | HR 91 | Temp 98.5°F | Resp 17 | Ht 65.0 in | Wt 244.0 lb

## 2018-05-20 DIAGNOSIS — N529 Male erectile dysfunction, unspecified: Secondary | ICD-10-CM

## 2018-05-20 DIAGNOSIS — M10071 Idiopathic gout, right ankle and foot: Secondary | ICD-10-CM | POA: Diagnosis not present

## 2018-05-20 DIAGNOSIS — M25471 Effusion, right ankle: Secondary | ICD-10-CM | POA: Diagnosis not present

## 2018-05-20 DIAGNOSIS — M25571 Pain in right ankle and joints of right foot: Secondary | ICD-10-CM

## 2018-05-20 MED ORDER — COLCHICINE 0.6 MG PO TABS: ORAL_TABLET | ORAL | 1 refills | Status: DC

## 2018-05-20 MED ORDER — SILDENAFIL CITRATE 25 MG PO TABS: 25.0000 mg | ORAL_TABLET | Freq: Every day | ORAL | 0 refills | Status: DC | PRN

## 2018-05-20 NOTE — Patient Instructions (Addendum)
Low-Purine Diet Purines are compounds that affect the level of uric acid in your body. A low-purine diet is a diet that is low in purines. Eating a low-purine diet can prevent the level of uric acid in your body from getting too high and causing gout or kidney stones or both. What do I need to know about this diet?  Choose low-purine foods. Examples of low-purine foods are listed in the next section.  Drink plenty of fluids, especially water. Fluids can help remove uric acid from your body. Try to drink 8-16 cups (1.9-3.8 L) a day.  Limit foods high in fat, especially saturated fat, as fat makes it harder for the body to get rid of uric acid. Foods high in saturated fat include pizza, cheese, ice cream, whole milk, fried foods, and gravies. Choose foods that are lower in fat and lean sources of protein. Use olive oil when cooking as it contains healthy fats that are not high in saturated fat.  Limit alcohol. Alcohol interferes with the elimination of uric acid from your body. If you are having a gout attack, avoid all alcohol.  Keep in mind that different people's bodies react differently to different foods. You will probably learn over time which foods do or do not affect you. If you discover that a food tends to cause your gout to flare up, avoid eating that food. You can more freely enjoy foods that do not cause problems. If you have any questions about a food item, talk to your dietitian or health care provider. Which foods are low, moderate, and high in purines? The following is a list of foods that are low, moderate, and high in purines. You can eat any amount of the foods that are low in purines. You may be able to have small amounts of foods that are moderate in purines. Ask your health care provider how much of a food moderate in purines you can have. Avoid foods high in purines. Grains  Foods low in purines: Enriched white bread, pasta, rice, cake, cornbread, popcorn.  Foods moderate in  purines: Whole-grain breads and cereals, wheat germ, bran, oatmeal. Uncooked oatmeal. Dry wheat bran or wheat germ.  Foods high in purines: Pancakes, French toast, biscuits, muffins. Vegetables  Foods low in purines: All vegetables, except those that are moderate in purines.  Foods moderate in purines: Asparagus, cauliflower, spinach, mushrooms, green peas. Fruits  All fruits are low in purines. Meats and other Protein Foods  Foods low in purines: Eggs, nuts, peanut butter.  Foods moderate in purines: 80-90% lean beef, lamb, veal, pork, poultry, fish, eggs, peanut butter, nuts. Crab, lobster, oysters, and shrimp. Cooked dried beans, peas, and lentils.  Foods high in purines: Anchovies, sardines, herring, mussels, tuna, codfish, scallops, trout, and haddock. Bacon. Organ meats (such as liver or kidney). Tripe. Game meat. Goose. Sweetbreads. Dairy  All dairy foods are low in purines. Low-fat and fat-free dairy products are best because they are low in saturated fat. Beverages  Drinks low in purines: Water, carbonated beverages, tea, coffee, cocoa.  Drinks moderate in purines: Soft drinks and other drinks sweetened with high-fructose corn syrup. Juices. To find whether a food or drink is sweetened with high-fructose corn syrup, look at the ingredients list.  Drinks high in purines: Alcoholic beverages (such as beer). Condiments  Foods low in purines: Salt, herbs, olives, pickles, relishes, vinegar.  Foods moderate in purines: Butter, margarine, oils, mayonnaise. Fats and Oils  Foods low in purines: All types, except gravies   and sauces made with meat.  Foods high in purines: Gravies and sauces made with meat. Other Foods  Foods low in purines: Sugars, sweets, gelatin. Cake. Soups made without meat.  Foods moderate in purines: Meat-based or fish-based soups, broths, or bouillons. Foods and drinks sweetened with high-fructose corn syrup.  Foods high in purines: High-fat desserts  (such as ice cream, cookies, cakes, pies, doughnuts, and chocolate). Contact your dietitian for more information on foods that are not listed here. This information is not intended to replace advice given to you by your health care provider. Make sure you discuss any questions you have with your health care provider. Document Released: 02/19/2011 Document Revised: 04/01/2016 Document Reviewed: 10/01/2013 Elsevier Interactive Patient Education  2017 [employer]     IF you received an x-ray today, you will receive an invoice from [facility]. Please contact [facility] at [phone] with questions or concerns regarding your invoice.   IF you received labwork today, you will receive an invoice from [facility]. Please contact [facility] at [phone] with questions or concerns regarding your invoice.   Our billing staff will not be able to assist you with questions regarding bills from these companies.  You will be contacted with the lab results as soon as they are available. The fastest way to get your results is to activate your My Chart account. Instructions are located on the last page of this paperwork. If you have not heard from us regarding the results in 2 weeks, please contact this office.

## 2018-05-20 NOTE — Progress Notes (Signed)
    MRN: [id number] DOB: [birth year]  Subjective:   Richard Berger is a 42 y.o. male presenting for 3 day history of worsening right ankle pain, redness. Patient stands during his work shifts. Denies fever, trauma. Has a history of chronic gout. Last gout attack was 3 months ago. Used to have them very frequently. Does eat a lot of meat, drink a lot of alcohol. He would also like to have a refill of sildenafil. Smokes 1 pack every 3-4 days. Denies chest pain, history of heart disease, diabetes.   Richard Berger is not currently taking any medications. Also has No Known Allergies.  Richard Berger  has a past medical history of Cataract and Gout. Also  has a past surgical history that includes Eye surgery (Right).  Objective:   Vitals: BP 102/67   Pulse 91   Temp 98.5 F (36.9 C) (Oral)   Resp 17   Ht 5\' 5"  (1.651 m)   Wt 244 lb (110.7 kg)   SpO2 98%   BMI 40.60 kg/m   Physical Exam  Constitutional: He is oriented to person, place, and time. He appears well-developed and well-nourished.  Cardiovascular: Normal rate, regular rhythm, normal heart sounds and intact distal pulses. Exam reveals no gallop and no friction rub.  No murmur heard. Pulmonary/Chest: Effort normal and breath sounds normal. No stridor. No respiratory distress. He has no wheezes. He has no rales.  Musculoskeletal:       Right ankle: He exhibits swelling. He exhibits normal range of motion, no ecchymosis, no deformity, no laceration and normal pulse. Tenderness. Lateral malleolus and medial malleolus tenderness found. Achilles tendon exhibits no pain and no defect.  Neurological: He is alert and oriented to person, place, and time.   Assessment and Plan :   Acute idiopathic gout of right ankle - Plan: Uric A+ANA+RA Qn+CRP+ASO, Basic metabolic panel  Pain and swelling of right ankle  Erectile dysfunction, unspecified erectile dysfunction type  Will start colchicine for gout attack of unknown etiology. Labs pending. Counseled on use  of sildenafil. I will only do one time refill and recommended that he quit smoking if he wants more refills. Patient verbalized understanding.   Wallis BambergMario Jahkeem Kurka, PA-C [facility] [phone] 05/20/2018  11:25 AM

## 2018-05-22 LAB — URIC A+ANA+RA QN+CRP+ASO
ASO: 24 IU/mL (ref 0.0–200.0)
Anti Nuclear Antibody(ANA): NEGATIVE
CRP: 4 mg/L (ref 0–10)
URIC ACID: 9.2 mg/dL — AB (ref 3.7–8.6)

## 2018-05-22 LAB — BASIC METABOLIC PANEL
BUN / CREAT RATIO: 7 — AB (ref 9–20)
BUN: 7 mg/dL (ref 6–24)
CHLORIDE: 100 mmol/L (ref 96–106)
CO2: 22 mmol/L (ref 20–29)
Calcium: 9.2 mg/dL (ref 8.7–10.2)
Creatinine, Ser: 1 mg/dL (ref 0.76–1.27)
GFR calc Af Amer: 108 mL/min/{1.73_m2} (ref >59)
GFR calc non Af Amer: 94 mL/min/{1.73_m2} (ref >59)
GLUCOSE: 94 mg/dL (ref 65–99)
Potassium: 4 mmol/L (ref 3.5–5.2)
Sodium: 138 mmol/L (ref 134–144)

## 2018-05-23 ENCOUNTER — Telehealth: Payer: Self-pay | Admitting: Urgent Care

## 2018-05-23 NOTE — Telephone Encounter (Signed)
Copied from CRM [id number]. Topic: General - Other >> May 23, 2018 10:00 AM Elliot GaultBell, Tiffany M wrote: Relation to pt: self Call back number:[phone]   Reason for call:  Patient returned Dr. Urban GibsonMani call regarding lab results, please advise

## 2018-05-23 NOTE — Telephone Encounter (Signed)
Left detailed message crm in 

## 2018-07-15 ENCOUNTER — Other Ambulatory Visit: Payer: Self-pay | Admitting: Urgent Care

## 2018-07-17 NOTE — Telephone Encounter (Signed)
Sildenafil 25 mg refill Last Refill:05/20/18 # 20 Last OV: 05/20/18 PCP: Wallis Bamberg Pharmacy:[facility] # [phone]

## 2018-08-19 ENCOUNTER — Encounter: Payer: Self-pay | Admitting: Family Medicine

## 2018-08-19 ENCOUNTER — Ambulatory Visit: Payer: PRIVATE HEALTH INSURANCE | Admitting: Family Medicine

## 2018-08-19 ENCOUNTER — Other Ambulatory Visit: Payer: Self-pay

## 2018-08-19 VITALS — BP 118/78 | HR 90 | Temp 98.2°F | Resp 16 | Ht 65.0 in | Wt 240.4 lb

## 2018-08-19 DIAGNOSIS — M2550 Pain in unspecified joint: Secondary | ICD-10-CM | POA: Diagnosis not present

## 2018-08-19 DIAGNOSIS — E790 Hyperuricemia without signs of inflammatory arthritis and tophaceous disease: Secondary | ICD-10-CM | POA: Diagnosis not present

## 2018-08-19 DIAGNOSIS — M10071 Idiopathic gout, right ankle and foot: Secondary | ICD-10-CM

## 2018-08-19 MED ORDER — PREDNISONE 20 MG PO TABS: ORAL_TABLET | ORAL | 0 refills | Status: DC

## 2018-08-19 MED ORDER — DICLOFENAC SODIUM 75 MG PO TBEC: 75.0000 mg | DELAYED_RELEASE_TABLET | Freq: Two times a day (BID) | ORAL | 3 refills | Status: DC

## 2018-08-19 MED ORDER — ALLOPURINOL 100 MG PO TABS: 100.0000 mg | ORAL_TABLET | Freq: Every day | ORAL | 6 refills | Status: DC

## 2018-08-19 NOTE — Progress Notes (Signed)
gou 

## 2018-08-19 NOTE — Progress Notes (Signed)
Patient ID: Richard Berger, male    DOB: 1975-12-27  Age: 42 y.o. MRN: [id number]  Chief Complaint  Patient presents with  . Gout    x 2 weeks - per patient right foot and all over body    Subjective:   Patient is here for recheck with regard to his gout.  It is been bothering him in a lot of joints but especially in his right foot lately.  Current allergies, medications, problem list, past/family and social histories reviewed.  Objective:  BP 118/78   Pulse 90   Temp 98.2 F (36.8 C) (Oral)   Resp 16   Ht 5\' 5"  (1.651 m)   Wt 240 lb 6.4 oz (109 kg)   SpO2 97%   BMI 40.00 kg/m   Previous elevated uric acid level is noted.  Hands are painful to grip.  Joints of the hands do not seem specifically swollen.  The right large toe is tender and swollen at the MCP joint.  Ankle is also tender.  Assessment & Plan:   Assessment: 1. Idiopathic gout of right foot, unspecified chronicity   2. Arthralgia, unspecified joint   3. Hyperuricemia       Plan: See instructions  Orders Placed This Encounter  Procedures  . Comprehensive metabolic panel  . Uric acid    Meds ordered this encounter  Medications  . diclofenac (VOLTAREN) 75 MG EC tablet    Sig: Take 1 tablet (75 mg total) by mouth 2 (two) times daily.    Dispense:  30 tablet    Refill:  3  . allopurinol (ZYLOPRIM) 100 MG tablet    Sig: Take 1 tablet (100 mg total) by mouth daily.    Dispense:  30 tablet    Refill:  6  . predniSONE (DELTASONE) 20 MG tablet    Sig: Take 3 pills each morning for 2 days, then 2 daily for 2 days, then 1 daily for 2 days for gout    Dispense:  12 tablet    Refill:  0         Patient Instructions   We are repeating some baseline labs today before starting you on the long-term treatment for gout.  We will let you know the results of those labs.  Take the prednisone 20 mg 3 pills every day for 2 days, then 2 pills every day for 2 days, then 1 pill every day for 2 days for fairly quick  relief of your gout pains.  Allopurinol 100 mg 1 pill daily for gout.  This needs to be taken on a daily basis to keep the uric acid level down to try and prevent recurrences of the chronic gout.  Take diclofenac 75 mg 1 twice daily as needed for painful flareups of the gout.  If it irritates her stomach discontinue it.  Take with food.  Do not take any ibuprofen or Aleve when you are on this medicine.  You can take some additional Tylenol 500 mg 2 pills 3 times daily if needed for aching and pains (acetaminophen)   Return in 3 months to get your laboratory tests rechecked to be sure your system is tolerating the allopurinol and that it is doing the proper job of lowering the uric acid.  Sometimes we need to increase the dose.  Return sooner if necessary.  I recommend you reconsider taking a flu shot.     Gout Gout is painful swelling that can occur in some of your joints. Gout  is a type of arthritis. This condition is caused by having too much uric acid in your body. Uric acid is a chemical that forms when your body breaks down substances called purines. Purines are important for building body proteins. When your body has too much uric acid, sharp crystals can form and build up inside your joints. This causes pain and swelling. Gout attacks can happen quickly and be very painful (acute gout). Over time, the attacks can affect more joints and become more frequent (chronic gout). Gout can also cause uric acid to build up under your skin and inside your kidneys. What are the causes? This condition is caused by too much uric acid in your blood. This can occur because:  Your kidneys do not remove enough uric acid from your blood. This is the most common cause.  Your body makes too much uric acid. This can occur with some cancers and cancer treatments. It can also occur if your body is breaking down too many red blood cells (hemolytic anemia).  You eat too many foods that are high in purines.  These foods include organ meats and some seafood. Alcohol, especially beer, is also high in purines.  A gout attack may be triggered by trauma or stress. What increases the risk? This condition is more likely to develop in people who:  Have a family history of gout.  Are male and middle-aged.  Are male and have gone through menopause.  Are obese.  Frequently drink alcohol, especially beer.  Are dehydrated.  Lose weight too quickly.  Have an organ transplant.  Have lead poisoning.  Take certain medicines, including aspirin, cyclosporine, diuretics, levodopa, and niacin.  Have kidney disease or psoriasis.  What are the signs or symptoms? An attack of acute gout happens quickly. It usually occurs in just one joint. The most common place is the big toe. Attacks often start at night. Other joints that may be affected include joints of the feet, ankle, knee, fingers, wrist, or elbow. Symptoms may include:  Severe pain.  Warmth.  Swelling.  Stiffness.  Tenderness. The affected joint may be very painful to touch.  Shiny, red, or purple skin.  Chills and fever.  Chronic gout may cause symptoms more frequently. More joints may be involved. You may also have white or yellow lumps (tophi) on your hands or feet or in other areas near your joints. How is this diagnosed? This condition is diagnosed based on your symptoms, medical history, and physical exam. You may have tests, such as:  Blood tests to measure uric acid levels.  Removal of joint fluid with a needle (aspiration) to look for uric acid crystals.  X-rays to look for joint damage.  How is this treated? Treatment for this condition has two phases: treating an acute attack and preventing future attacks. Acute gout treatment may include medicines to reduce pain and swelling, including:  NSAIDs.  Steroids. These are strong anti-inflammatory medicines that can be taken by mouth (orally) or injected into a  joint.  Colchicine. This medicine relieves pain and swelling when it is taken soon after an attack. It can be given orally or through an IV tube.  Preventive treatment may include:  Daily use of smaller doses of NSAIDs or colchicine.  Use of a medicine that reduces uric acid levels in your blood.  Changes to your diet. You may need to see a specialist about healthy eating (dietitian).  Follow these instructions at home: During a Gout Attack  If directed,  apply ice to the affected area: ? Put ice in a plastic bag. ? Place a towel between your skin and the bag. ? Leave the ice on for 20 minutes, 2-3 times a day.  Rest the joint as much as possible. If the affected joint is in your leg, you may be given crutches to use.  Raise (elevate) the affected joint above the level of your heart as often as possible.  Drink enough fluids to keep your urine clear or pale yellow.  Take over-the-counter and prescription medicines only as told by your health care provider.  Do not drive or operate heavy machinery while taking prescription pain medicine.  Follow instructions from your health care provider about eating or drinking restrictions.  Return to your normal activities as told by your health care provider. Ask your health care provider what activities are safe for you. Avoiding Future Gout Attacks  Follow a low-purine diet as told by your dietitian or health care provider. Avoid foods and drinks that are high in purines, including liver, kidney, anchovies, asparagus, herring, mushrooms, mussels, and beer.  Limit alcohol intake to no more than 1 drink a day for nonpregnant women and 2 drinks a day for men. One drink equals 12 oz of beer, 5 oz of wine, or 1 oz of hard liquor.  Maintain a healthy weight or lose weight if you are overweight. If you want to lose weight, talk with your health care provider. It is important that you do not lose weight too quickly.  Start or maintain an  exercise program as told by your health care provider.  Drink enough fluids to keep your urine clear or pale yellow.  Take over-the-counter and prescription medicines only as told by your health care provider.  Keep all follow-up visits as told by your health care provider. This is important. Contact a health care provider if:  You have another gout attack.  You continue to have symptoms of a gout attack after10 days of treatment.  You have side effects from your medicines.  You have chills or a fever.  You have burning pain when you urinate.  You have pain in your lower back or belly. Get help right away if:  You have severe or uncontrolled pain.  You cannot urinate. This information is not intended to replace advice given to you by your health care provider. Make sure you discuss any questions you have with your health care provider. Document Released: 10/22/2000 Document Revised: 04/01/2016 Document Reviewed: 08/07/2015 Elsevier Interactive Patient Education  [employer].    If you have lab work done today you will be contacted with your lab results within the next 2 weeks.  If you have not heard from [place] then please contact us. The fastest way to get your results is to register for My Chart.   IF you received an x-ray today, you will receive an invoice from [facility]. Please contact [facility] at [phone] with questions or concerns regarding your invoice.   IF you received labwork today, you will receive an invoice from [facility]. Please contact [facility] at [phone] with questions or concerns regarding your invoice.   Our billing staff will not be able to assist you with questions regarding bills from these companies.  You will be contacted with the lab results as soon as they are available. The fastest way to get your results is to activate your My Chart account. Instructions are located on the last page of this paperwork. If  you have not  heard from [place] regarding the results in 2 weeks, please contact this office.        Return in about 3 months (around 11/19/2018) for Follow-up on uric acid level and gout treatment.Janace Hoard, MD 08/19/2018

## 2018-08-19 NOTE — Patient Instructions (Addendum)
We are repeating some baseline labs today before starting you on the long-term treatment for gout.  We will let you know the results of those labs.  Take the prednisone 20 mg 3 pills every day for 2 days, then 2 pills every day for 2 days, then 1 pill every day for 2 days for fairly quick relief of your gout pains.  Allopurinol 100 mg 1 pill daily for gout.  This needs to be taken on a daily basis to keep the uric acid level down to try and prevent recurrences of the chronic gout.  Take diclofenac 75 mg 1 twice daily as needed for painful flareups of the gout.  If it irritates her stomach discontinue it.  Take with food.  Do not take any ibuprofen or Aleve when you are on this medicine.  You can take some additional Tylenol 500 mg 2 pills 3 times daily if needed for aching and pains (acetaminophen)   Return in 3 months to get your laboratory tests rechecked to be sure your system is tolerating the allopurinol and that it is doing the proper job of lowering the uric acid.  Sometimes we need to increase the dose.  Return sooner if necessary.  I recommend you reconsider taking a flu shot.     Gout Gout is painful swelling that can occur in some of your joints. Gout is a type of arthritis. This condition is caused by having too much uric acid in your body. Uric acid is a chemical that forms when your body breaks down substances called purines. Purines are important for building body proteins. When your body has too much uric acid, sharp crystals can form and build up inside your joints. This causes pain and swelling. Gout attacks can happen quickly and be very painful (acute gout). Over time, the attacks can affect more joints and become more frequent (chronic gout). Gout can also cause uric acid to build up under your skin and inside your kidneys. What are the causes? This condition is caused by too much uric acid in your blood. This can occur because:  Your kidneys do not remove enough uric  acid from your blood. This is the most common cause.  Your body makes too much uric acid. This can occur with some cancers and cancer treatments. It can also occur if your body is breaking down too many red blood cells (hemolytic anemia).  You eat too many foods that are high in purines. These foods include organ meats and some seafood. Alcohol, especially beer, is also high in purines.  A gout attack may be triggered by trauma or stress. What increases the risk? This condition is more likely to develop in people who:  Have a family history of gout.  Are male and middle-aged.  Are male and have gone through menopause.  Are obese.  Frequently drink alcohol, especially beer.  Are dehydrated.  Lose weight too quickly.  Have an organ transplant.  Have lead poisoning.  Take certain medicines, including aspirin, cyclosporine, diuretics, levodopa, and niacin.  Have kidney disease or psoriasis.  What are the signs or symptoms? An attack of acute gout happens quickly. It usually occurs in just one joint. The most common place is the big toe. Attacks often start at night. Other joints that may be affected include joints of the feet, ankle, knee, fingers, wrist, or elbow. Symptoms may include:  Severe pain.  Warmth.  Swelling.  Stiffness.  Tenderness. The affected joint may be very painful  to touch.  Shiny, red, or purple skin.  Chills and fever.  Chronic gout may cause symptoms more frequently. More joints may be involved. You may also have white or yellow lumps (tophi) on your hands or feet or in other areas near your joints. How is this diagnosed? This condition is diagnosed based on your symptoms, medical history, and physical exam. You may have tests, such as:  Blood tests to measure uric acid levels.  Removal of joint fluid with a needle (aspiration) to look for uric acid crystals.  X-rays to look for joint damage.  How is this treated? Treatment for this  condition has two phases: treating an acute attack and preventing future attacks. Acute gout treatment may include medicines to reduce pain and swelling, including:  NSAIDs.  Steroids. These are strong anti-inflammatory medicines that can be taken by mouth (orally) or injected into a joint.  Colchicine. This medicine relieves pain and swelling when it is taken soon after an attack. It can be given orally or through an IV tube.  Preventive treatment may include:  Daily use of smaller doses of NSAIDs or colchicine.  Use of a medicine that reduces uric acid levels in your blood.  Changes to your diet. You may need to see a specialist about healthy eating (dietitian).  Follow these instructions at home: During a Gout Attack  If directed, apply ice to the affected area: ? Put ice in a plastic bag. ? Place a towel between your skin and the bag. ? Leave the ice on for 20 minutes, 2-3 times a day.  Rest the joint as much as possible. If the affected joint is in your leg, you may be given crutches to use.  Raise (elevate) the affected joint above the level of your heart as often as possible.  Drink enough fluids to keep your urine clear or pale yellow.  Take over-the-counter and prescription medicines only as told by your health care provider.  Do not drive or operate heavy machinery while taking prescription pain medicine.  Follow instructions from your health care provider about eating or drinking restrictions.  Return to your normal activities as told by your health care provider. Ask your health care provider what activities are safe for you. Avoiding Future Gout Attacks  Follow a low-purine diet as told by your dietitian or health care provider. Avoid foods and drinks that are high in purines, including liver, kidney, anchovies, asparagus, herring, mushrooms, mussels, and beer.  Limit alcohol intake to no more than 1 drink a day for nonpregnant women and 2 drinks a day for men.  One drink equals 12 oz of beer, 5 oz of wine, or 1 oz of hard liquor.  Maintain a healthy weight or lose weight if you are overweight. If you want to lose weight, talk with your health care provider. It is important that you do not lose weight too quickly.  Start or maintain an exercise program as told by your health care provider.  Drink enough fluids to keep your urine clear or pale yellow.  Take over-the-counter and prescription medicines only as told by your health care provider.  Keep all follow-up visits as told by your health care provider. This is important. Contact a health care provider if:  You have another gout attack.  You continue to have symptoms of a gout attack after10 days of treatment.  You have side effects from your medicines.  You have chills or a fever.  You have burning pain when  you urinate.  You have pain in your lower back or belly. Get help right away if:  You have severe or uncontrolled pain.  You cannot urinate. This information is not intended to replace advice given to you by your health care provider. Make sure you discuss any questions you have with your health care provider. Document Released: 10/22/2000 Document Revised: 04/01/2016 Document Reviewed: 08/07/2015 Elsevier Interactive Patient Education  [employer].    If you have lab work done today you will be contacted with your lab results within the next 2 weeks.  If you have not heard from [place] then please contact us. The fastest way to get your results is to register for My Chart.   IF you received an x-ray today, you will receive an invoice from [facility]. Please contact [facility] at [phone] with questions or concerns regarding your invoice.   IF you received labwork today, you will receive an invoice from [facility]. Please contact [facility] at [phone] with questions or concerns regarding your invoice.   Our billing staff will not be able to assist  you with questions regarding bills from these companies.  You will be contacted with the lab results as soon as they are available. The fastest way to get your results is to activate your My Chart account. Instructions are located on the last page of this paperwork. If you have not heard from [place] regarding the results in 2 weeks, please contact this office.

## 2018-08-20 LAB — COMPREHENSIVE METABOLIC PANEL
ALK PHOS: 76 IU/L (ref 39–117)
ALT: 37 IU/L (ref 0–44)
AST: 27 IU/L (ref 0–40)
Albumin/Globulin Ratio: 1.6 (ref 1.2–2.2)
Albumin: 4.2 g/dL (ref 3.5–5.5)
BILIRUBIN TOTAL: 0.3 mg/dL (ref 0.0–1.2)
BUN/Creatinine Ratio: 7 — ABNORMAL LOW (ref 9–20)
BUN: 7 mg/dL (ref 6–24)
CHLORIDE: 101 mmol/L (ref 96–106)
CO2: 23 mmol/L (ref 20–29)
Calcium: 9.5 mg/dL (ref 8.7–10.2)
Creatinine, Ser: 1 mg/dL (ref 0.76–1.27)
GFR calc Af Amer: 108 mL/min/{1.73_m2} (ref >59)
GFR calc non Af Amer: 94 mL/min/{1.73_m2} (ref >59)
GLUCOSE: 88 mg/dL (ref 65–99)
Globulin, Total: 2.7 g/dL (ref 1.5–4.5)
POTASSIUM: 4 mmol/L (ref 3.5–5.2)
Sodium: 140 mmol/L (ref 134–144)
TOTAL PROTEIN: 6.9 g/dL (ref 6.0–8.5)

## 2018-08-20 LAB — URIC ACID: URIC ACID: 7.5 mg/dL (ref 3.7–8.6)

## 2018-09-28 ENCOUNTER — Telehealth: Payer: Self-pay | Admitting: Family Medicine

## 2018-09-28 NOTE — Telephone Encounter (Signed)
Pt given lab results per notes of Dr Alwyn RenHopper on 09/28/18. Pt verbalized understanding.  Results not sent to result notes.

## 2018-10-02 ENCOUNTER — Other Ambulatory Visit: Payer: Self-pay | Admitting: Urgent Care

## 2018-10-02 ENCOUNTER — Encounter: Payer: Self-pay | Admitting: *Deleted

## 2019-01-26 IMAGING — DX DG HUMERUS 2V *L*
2 series · 2 of 2 positions shown · non-contrast
Comparison: None.

CLINICAL DATA: Left upper arm pain, no injury

EXAM:
LEFT HUMERUS - 2+ VIEW

[humerus ap]
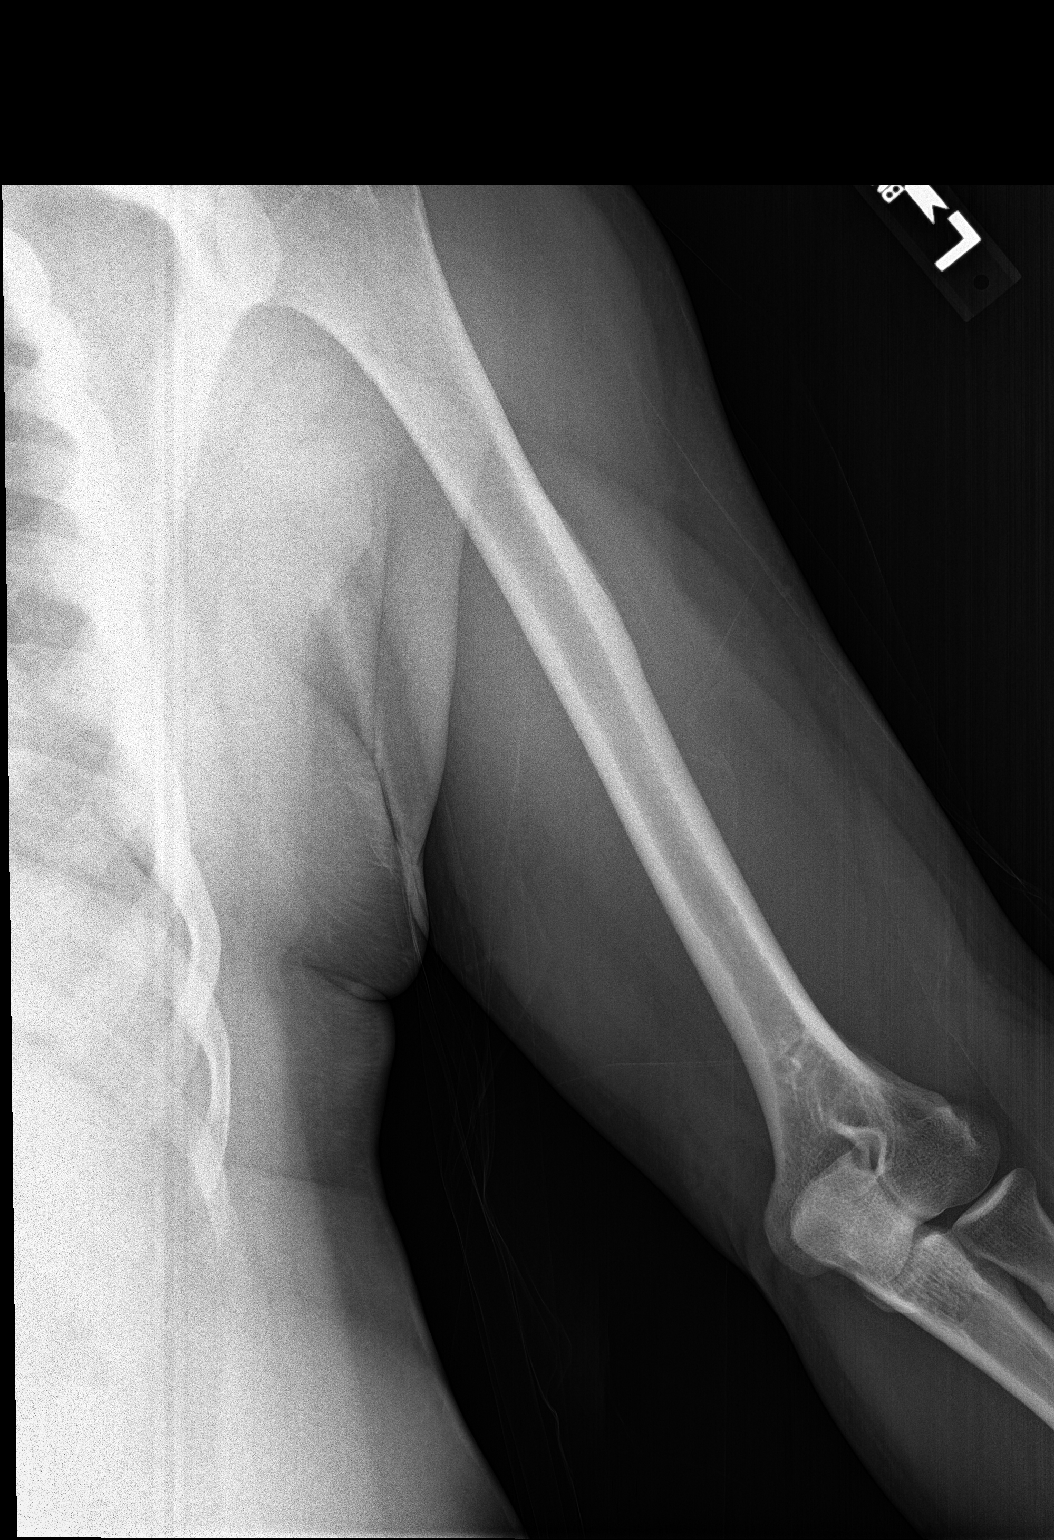

[humerus lat]
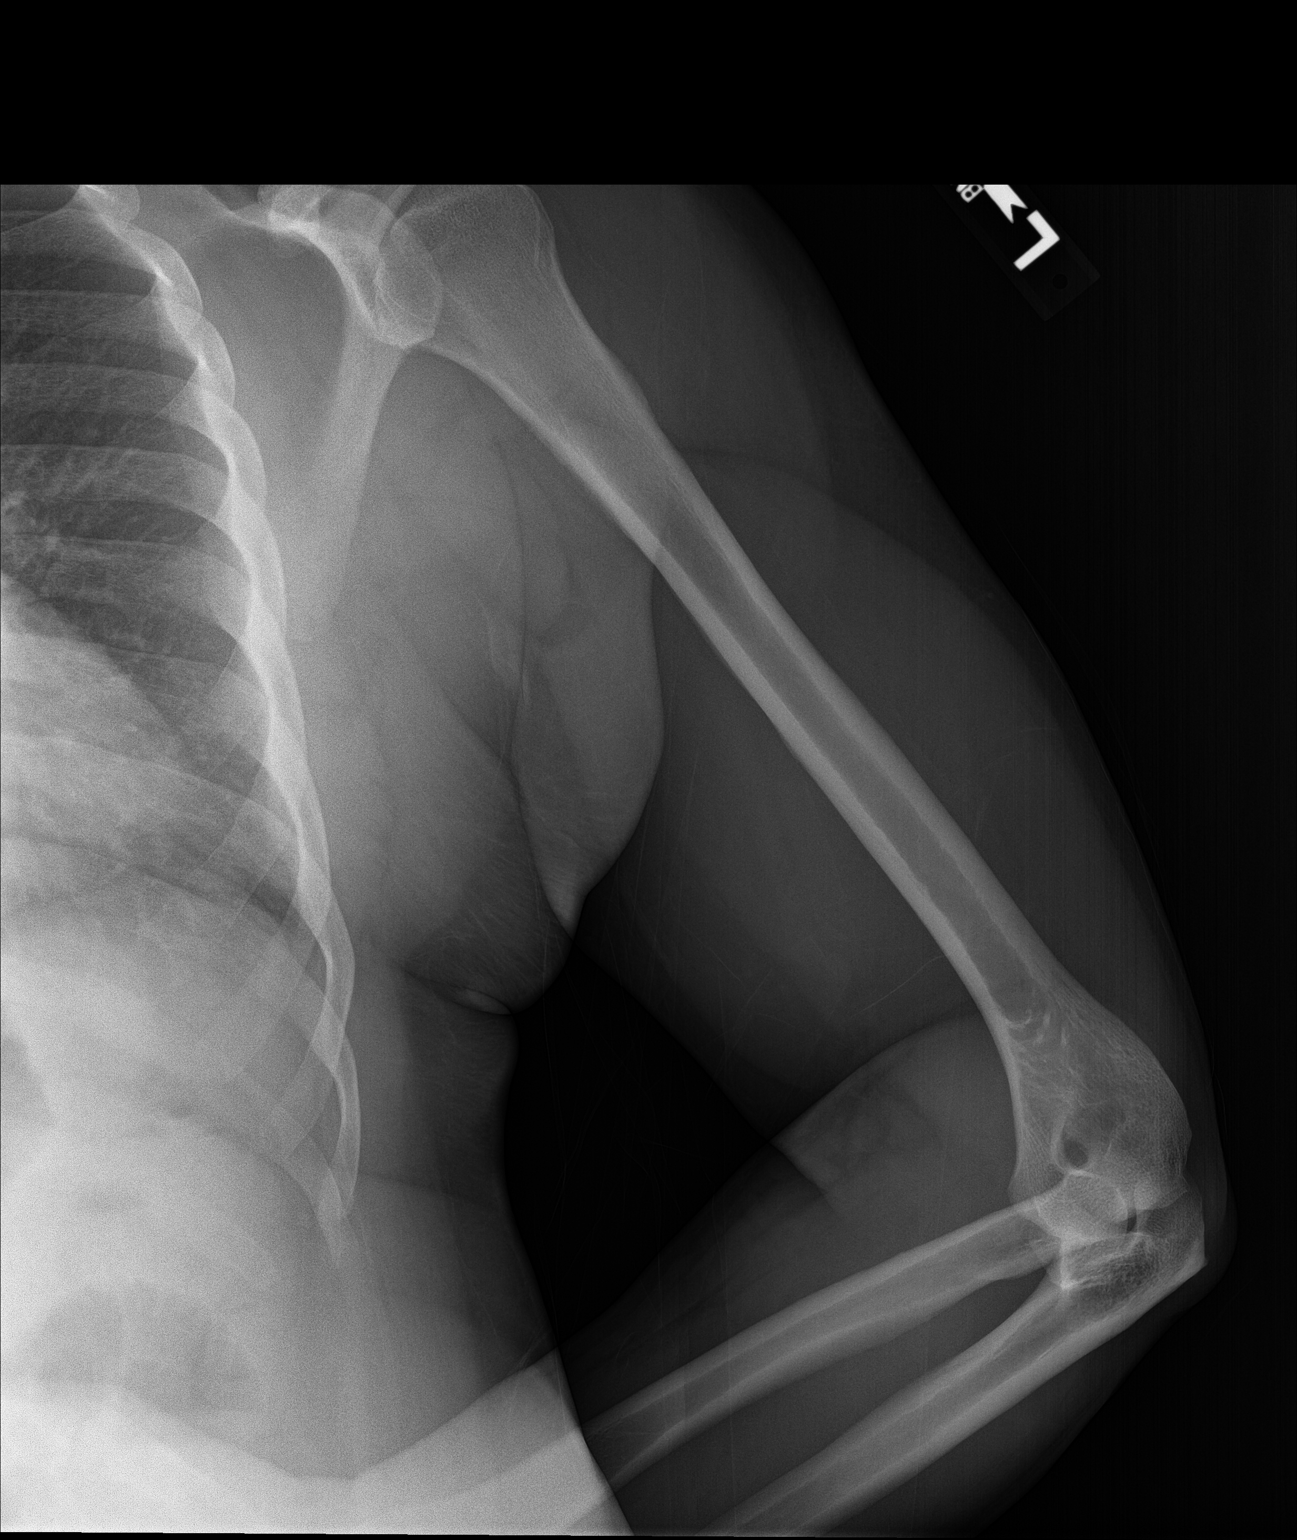

[2 of 2 positions shown; findings below may reference images not displayed]

FINDINGS: The left humerus is intact and normally aligned. No acute
abnormality is seen. No significant degenerative change is evident.
IMPRESSION: Negative.

## 2021-05-20 ENCOUNTER — Ambulatory Visit: Payer: Self-pay

## 2021-05-20 ENCOUNTER — Encounter: Payer: Self-pay | Admitting: Internal Medicine

## 2021-05-20 ENCOUNTER — Other Ambulatory Visit: Payer: Self-pay

## 2021-05-20 ENCOUNTER — Ambulatory Visit (INDEPENDENT_AMBULATORY_CARE_PROVIDER_SITE_OTHER): Payer: No Typology Code available for payment source | Admitting: Internal Medicine

## 2021-05-20 VITALS — BP 118/76 | HR 73 | Ht 64.75 in | Wt 241.0 lb

## 2021-05-20 DIAGNOSIS — R768 Other specified abnormal immunological findings in serum: Secondary | ICD-10-CM | POA: Diagnosis not present

## 2021-05-20 DIAGNOSIS — M79641 Pain in right hand: Secondary | ICD-10-CM | POA: Diagnosis not present

## 2021-05-20 DIAGNOSIS — M79671 Pain in right foot: Secondary | ICD-10-CM | POA: Diagnosis not present

## 2021-05-20 DIAGNOSIS — M79642 Pain in left hand: Secondary | ICD-10-CM

## 2021-05-20 DIAGNOSIS — M79672 Pain in left foot: Secondary | ICD-10-CM | POA: Diagnosis not present

## 2021-05-20 DIAGNOSIS — M10071 Idiopathic gout, right ankle and foot: Secondary | ICD-10-CM | POA: Diagnosis not present

## 2021-05-20 MED ORDER — COLCHICINE 0.6 MG PO TABS: 0.6000 mg | ORAL_TABLET | Freq: Two times a day (BID) | ORAL | 0 refills | Status: DC | PRN

## 2021-05-20 MED ORDER — FEBUXOSTAT 40 MG PO TABS: 40.0000 mg | ORAL_TABLET | Freq: Every day | ORAL | 0 refills | Status: DC

## 2021-05-20 NOTE — Progress Notes (Signed)
Office Visit Note  Patient: Richard Berger             Date of Birth: 06-11-1976           MRN: [id number]             PCP: Jackie Plum, MD Referring: Norm Salt, PA Visit Date: 05/20/2021  Subjective:  New Patient (Initial Visit) (Patient was being treated for gout and has quit within the last week. Patient complains of bilateral hand, bilateral knee, and bilateral foot pain. )   History of Present Illness: Richard Berger is a 45 y.o. male here for recurrent gout and positive ANA. His primary care office has seen him for podagra treated with prednisone and colchicine tablets.  He was also started apparently on Uloric 40 mg p.o. daily for hyperuricemia but states he is not taking the medicine since a few days ago.  He has had continued joint pain in his hands and knees and feet the most recent active swelling and severe pain was in the right great toe.  He has had infrequent swelling of the left great toe and has had involvement of bilateral knees he denies any obvious episodes of swelling inflammation involving the upper extremities.  From his history it is not very clear his exact previous treatments he is taken multiple medications including diclofenac, prednisone, colchicine, and topical Voltaren for joint inflammation but is currently off the colchicine.  He reports typically noticing improvement while taking this but rapid return of symptoms after discontinuation.  Labs reviewed 03/2021 ANA 1:40 cytoplasmic RF neg CCP neg   Activities of Daily Living:  Patient reports morning stiffness for several hours.   Patient Reports nocturnal pain.  Difficulty dressing/grooming: Reports Difficulty climbing stairs: Denies Difficulty getting out of chair: Denies Difficulty using hands for taps, buttons, cutlery, and/or writing: Reports  Review of Systems  Constitutional:  Negative for fatigue.  HENT:  Negative for mouth sores, mouth dryness and nose dryness.   Eyes:  Negative  for pain, itching, visual disturbance and dryness.  Respiratory:  Negative for cough, hemoptysis, shortness of breath and difficulty breathing.   Cardiovascular:  Negative for chest pain, palpitations and swelling in legs/feet.  Gastrointestinal:  Positive for constipation. Negative for abdominal pain, blood in stool and diarrhea.  Endocrine: Negative for increased urination.  Genitourinary:  Negative for painful urination.  Musculoskeletal:  Positive for joint pain, joint pain, joint swelling and morning stiffness. Negative for myalgias, muscle weakness, muscle tenderness and myalgias.  Skin:  Negative for color change, rash and redness.  Allergic/Immunologic: Positive for susceptible to infections.  Neurological:  Positive for headaches. Negative for dizziness, numbness, memory loss and weakness.  Hematological:  Negative for swollen glands.  Psychiatric/Behavioral:  Negative for confusion and sleep disturbance.    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 No pertinent family history. Past Surgical History:  Procedure Laterality Date   EYE SURGERY Right    Cataract   Social History   Social  History Narrative   Marital status: single; dating. Moved from [place] to [place] in 2001 with his father.  Family (1 brother and 1 sister) in [place]. Mother died there 02/07/13.      Children: one son in [place].      Employment: employed      Tobacco: 1-2 cigarettes a week.      Alcohol: alcohol on weekends.         Immunization History  Administered Date(s) Administered   Tdap 04/28/2014     Objective: Vital Signs: BP 118/76 (BP Location: Left Arm, Patient Position: Sitting, Cuff Size: Large)   Pulse 73   Ht 5' 4.75" (1.645 m)   Wt 241 lb (109.3 kg)   BMI 40.41 kg/m    Physical Exam Constitutional:      Appearance: He is obese.  Eyes:     Conjunctiva/sclera: Conjunctivae normal.  Cardiovascular:     Rate and Rhythm: Normal rate and regular rhythm.  Pulmonary:     Effort: Pulmonary effort is normal.     Breath sounds: Normal breath sounds.  Skin:    General: Skin is warm and dry.     Comments: Hyperpigmentation over right first MTP joint  Neurological:     General: No focal deficit present.     Mental Status: He is alert.     Deep Tendon Reflexes: Reflexes normal.  Psychiatric:        Mood and Affect: Mood normal.     Musculoskeletal Exam:  Shoulders full ROM no tenderness or swelling Elbows full ROM no tenderness or swelling Wrists full ROM no tenderness or swelling Fingers full ROM, tenderness to pressure over the right second and third MCP joints and left third PIP joint without appreciable synovitis Knees full ROM, bilateral patellofemoral crepitus, left knee tenderness to palpation with no effusions Ankles full ROM no tenderness or swelling MTPs full ROM no tenderness or swelling    Investigation: No additional findings.  Imaging: XR Foot 2 Views Left  Result Date: 05/20/2021 X-ray left foot 2 views Probable tibiotalar degenerative generative joint changes with anterior and posterior osteophytes.  Mild midfoot degenerative arthritis changes left compared to right foot.  First MTP joint degenerative arthritis other MTPs appear intact.  No erosions or abnormal mineralization are seen. 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  Very small plantar and posterior calcaneal enthesophytes.  Degenerative joint changes in midfoot joints seen dorsally.  First MTP joint with mild joint space narrowing and cystic changes no obvious erosive changes.  Few cystic changes present in proximal phalanges. Impression Degenerative changes present throughout the foot and few cystic changes no obvious erosions or tophi  XR Hand 2 View Left  Result Date: 05/20/2021 X-ray left hand 2 views Radiocarpal carpal joint spaces appear normal.  Partially flattened ulnar styloid that appears symmetric with right hand x-ray.  Early osteophyte formation of third proximal phalanx and MCP joint.  Joint spaces appear preserved no erosions or demineralization are seen. Impression Early MCP joint evidence of degenerative changes otherwise normal appearing hand xray  XR Hand 2 View Right  Result Date: 05/20/2021 X-ray right hand 2 views Radiocarpal joint space appears normal ulnar styloid appears symmetric with left hand x-ray.  Degenerative change of the first [facility] joint without  significant subluxation.  Second MCP joint with periarticular calcification otherwise normal appearance.  Joint spaces appear well-preserved and no erosion changes are seen. 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  Procedures:   No procedures performed Allergies: Patient has no known allergies.   Assessment / Plan:     Visit Diagnoses: Idiopathic gout of right foot, unspecified chronicity - Plan: XR Hand 2 View Right, XR Hand 2 View Left, XR Foot 2 Views Right, XR Foot 2 Views Left, Sedimentation rate, CBC with Differential/Platelet, COMPLETE METABOLIC PANEL WITH GFR, Uric acid, colchicine (COLCRYS) 0.6 MG tablet, febuxostat (ULORIC) 40 MG tablet  History of chronic gout current symptoms appear consistent with chronic gout possible component of generalized osteoarthritis.  Checking bilateral hand and feet x-rays for any evidence of erosive disease given several years of inflammation history.  Checking uric acid and sedimentation rate.  Plan to continue current medical therapy with febuxostat 40 mg p.o. daily and colchicine 0.6 mg tablet recommended taking once or twice daily until current inflammation improved.  Checking CBC and CMP for therapeutic drug monitoring.  Plan to follow-up in 1 month to reassess response to treatment and monitoring.  Positive ANA (antinuclear antibody)  Low positive ANA exam and review of systems does not demonstrate clinical criteria of systemic connective tissue disease at this time.  I have a low suspicion for this contributing to current symptoms no additional work-up at this time.  Bilateral hand pain - Plan: XR Hand 2 View Right, XR Hand 2 View Left  Bilateral arm and hand pain without reported episodic flares and without swelling changes checking bile hand x-rays today.  Orders: Orders Placed This Encounter  Procedures   XR Hand 2 View Right   XR Hand 2 View Left   XR Foot 2 Views Right   XR Foot 2 Views Left   Sedimentation rate   CBC with Differential/Platelet   COMPLETE METABOLIC PANEL WITH GFR   Uric acid    Meds ordered this encounter  Medications   colchicine (COLCRYS) 0.6 MG tablet    Sig: Take 1 tablet (0.6 mg total) by mouth 2 (two) times daily as needed.     Dispense:  60 tablet    Refill:  0   febuxostat (ULORIC) 40 MG tablet    Sig: Take 1 tablet (40 mg total) by mouth daily.    Dispense:  30 tablet    Refill:  0    Continuation of outpatient therapy      Follow-Up Instructions: Return in about 4 weeks (around 06/17/2021) for New pt gout Uloric+colchicine f/u 4wks.   Fuller Plan, MD  Note - This record has been created using [employer].  Chart creation errors have been sought, but may not always  have been located. Such creation errors do not reflect on  the standard of medical care.

## 2021-05-20 NOTE — Patient Instructions (Addendum)
Your symptoms look consistent with gout I recommend resuming the uloric 40 mg once daily this treatment will work to lower uric acid to prevent future attacks of joint inflammation. In the short term I recommend taking the colchicine 0.6 mg you can take this daily for joint swelling or up to 2 times daily as needed if you feel symptoms flare up.  I am checking blood level of uric acid also checking your blood counts and kidney function test to make sure there are no side effects with taking the medications. We can follow up in 1 month of taking the uloric to check the uric acid level again.  I am checking xrays to make sure there is no alternative problem causing symptoms also looking for evidence of damage from the gout.   Colchicine Tablets What is this medication? COLCHICINE (KOL chi seen) prevents and treats gout attacks. It may also be used to treat familial Mediterranean fever (FMF). It works by decreasinginflammation and reducing the buildup of uric acid in your joints. This medicine may be used for other purposes; ask your health care provider orpharmacist if you have questions. COMMON BRAND NAME(S): Colcrys What should I tell my care team before I take this medication? They need to know if you have any of these conditions: Kidney disease Liver disease An unusual or allergic reaction to colchicine, other medications, foods, dyes, or preservatives Pregnant or trying to get pregnant Breast-feeding How should I use this medication? Take this medication by mouth with a full glass of water. Follow the directions on the prescription label. You can take it with or without food. If it upsets your stomach, take it with food. Take your medication at regular intervals. Donot take your medication more often than directed. A special MedGuide will be given to you by the pharmacist with eachprescription and refill. Be sure to read this information carefully each time. Talk to your care team regarding  the use of this medication in children. While this medication may be prescribed for children as young as 36 years old forselected conditions, precautions do apply. Patients over 16 years old may have a stronger reaction and need a smaller dose. Overdosage: If you think you have taken too much of this medicine contact apoison control center or emergency room at once. NOTE: This medicine is only for you. Do not share this medicine with others. What if I miss a dose? If you miss a dose, take it as soon as you can. If it is almost time for yournext dose, take only that dose. Do not take double or extra doses. What may interact with this medication? Do not take this medication with any of the following: Certain antivirals for HIV or hepatitis This medication may also interact with the following: Certain antibiotics like erythromycin or clarithromycin Certain medications for blood pressure, heart disease, irregular heart beat Certain medications for cholesterol like atorvastatin, lovastatin, and simvastatin Certain medications for fungal infections like ketoconazole, itraconazole, or posaconazole Cyclosporine Grapefruit or grapefruit juice This list may not describe all possible interactions. Give your health care provider a list of all the medicines, herbs, non-prescription drugs, or dietary supplements you use. Also tell them if you smoke, drink alcohol, or use illegaldrugs. Some items may interact with your medicine. What should I watch for while using this medication? Visit your care team for regular checks on your progress. Tell your care teamif your symptoms do not start to get better or if they get worse. You should make sure  you get enough vitamin B12 while you are taking this medication. Discuss the foods you eat and the vitamins you take with your careteam. This medication may increase your risk to bruise or bleed. Call you care teamif you notice any unusual bleeding. What side effects may I  notice from receiving this medication? Side effects that you should report to your care team as soon as possible: Allergic reactions-skin rash, itching, hives, swelling of the face, lips, tongue, or throat Infection-fever, chills, cough, sore throat, wounds that don't heal, pain or trouble when passing urine, general feeling of discomfort or being unwell Muscle injury-unusual weakness or fatigue, muscle pain, dark yellow or brown urine, decrease in the amount of urine Pain, tingling, or numbness in the hands or feet Unusual bleeding or bruising Side effects that usually do not require medical attention (report these toyour care team if they continue or are bothersome): Diarrhea Nausea Vomiting This list may not describe all possible side effects. Call your doctor for medical advice about side effects. You may report side effects to FDA at1-800-FDA-1088. Where should I keep my medication? Keep out of the reach of children and pets. Store at room temperature between 15 and 30 degrees C (59 and 86 degrees F). Keep container tightly closed. Protect from light. Throw away any unusedmedication after the expiration date.  Febuxostat oral tablets What is this medication? FEBUXOSTAT (feb UX oh stat) is used to treat gout. People with gout have too much uric acid in their body. This medicine works to lower how much uric acidthe body makes. This medicine may be used for other purposes; ask your health care provider orpharmacist if you have questions. COMMON BRAND NAME(S): Uloric What should I tell my care team before I take this medication? They need to know if you have any of these conditions: heart disease history of heart attack or stroke kidney disease liver disease taking azathioprine or mercaptopurine an unusual or allergic reaction to febuxostat, other medicines, foods, dyes, or preservatives pregnant or trying to get pregnant breast feeding How should I use this medication? Take this  medicine by mouth with a glass of water. Follow the directions on the prescription label. You can take it with or without food. Take your medicine at regular intervals. Do not take it more often than directed. Do not stop takingexcept on your doctor's advice. Talk to your pediatrician regarding the use of this medicine in children. Thismedicine is not approved for use in children. Overdosage: If you think you have taken too much of this medicine contact apoison control center or emergency room at once. NOTE: This medicine is only for you. Do not share this medicine with others. What if I miss a dose? If you miss a dose, take it as soon as you can. If it is almost time for yournext dose, take only that dose. Do not take double or extra doses. What may interact with this medication? Do not take this medicine with any of the following medications: azathioprine mercaptopurine This medicine may also interact with the following medications: aminophylline certain medicines used to treat cancer pegloticase rasburicase theophylline This list may not describe all possible interactions. Give your health care provider a list of all the medicines, herbs, non-prescription drugs, or dietary supplements you use. Also tell them if you smoke, drink alcohol, or use illegaldrugs. Some items may interact with your medicine. What should I watch for while using this medication? Visit your healthcare provider for regular checks on your progress. Tell your  healthcare professional if your symptoms do not start to get better or if theyget worse. You will need regular blood tests while you are taking this medicine. Your gout may flare up when you are first taking this medicine. Do not stop taking this medicine, even if you have a flare. Your doctor or healthcareprovider may give you another medicine to help treat the gout pain. This medicine may cause serious skin reactions. They can happen weeks to months after starting  the medicine. Contact your healthcare provider right away if you notice fevers or flu-like symptoms with a rash. The rash may be red or purple and then turn into blisters or peeling of the skin. Or, you might notice a red rash with swelling of the face, lips, or lymph nodes in your neck or under yourarms. What side effects may I notice from receiving this medication? Side effects that you should report to your doctor or health care professionalas soon as possible: allergic reactions like skin rash, itching or hives, swelling of the face, lips, or tongue breathing problems chest pain or chest tightness fast, irregular heartbeat feeling faint or lightheaded rash, fever, and swollen lymph nodes redness, blistering, peeling, or loosening of the skin, including inside the mouth signs and symptoms of liver injury like dark yellow or brown urine; general ill feeling or flu-like symptoms; light-colored stools; loss of appetite; nausea; right upper belly pain; unusually weak or tired; yellowing of the eyes or skin signs and symptoms of a stroke like changes in vision; confusion; trouble speaking or understanding; severe headaches; sudden numbness or weakness of the face, arm or leg; trouble walking; dizziness; loss of balance or coordination Side effects that usually do not require medical attention (report to yourdoctor or health care professional if they continue or are bothersome): changes in appetite joint pain nausea, vomiting upset stomach This list may not describe all possible side effects. Call your doctor for medical advice about side effects. You may report side effects to FDA at1-800-FDA-1088. Where should I keep my medication? Keep out of the reach of children. Store at room temperature between 15 and 30 degrees C (59 and 86 degrees F). Protect from light and moisture. Throw away any unused medicine after theexpiration date. NOTE: This sheet is a summary. It may not cover all possible  information. If you have questions about this medicine, talk to your doctor, pharmacist, orhealth care provider.  2022 Elsevier/Gold Standard (2019-01-25 14:09:48)

## 2021-05-21 LAB — CBC WITH DIFFERENTIAL/PLATELET
Absolute Monocytes: 1041 cells/uL — ABNORMAL HIGH (ref 200–950)
Basophils Absolute: 47 cells/uL (ref 0–200)
Basophils Relative: 0.4 %
Eosinophils Absolute: 82 cells/uL (ref 15–500)
Eosinophils Relative: 0.7 %
HCT: 46.3 % (ref 38.5–50.0)
Hemoglobin: 15.7 g/dL (ref 13.2–17.1)
Lymphs Abs: 4984 cells/uL — ABNORMAL HIGH (ref 850–3900)
MCH: 29.5 pg (ref 27.0–33.0)
MCHC: 33.9 g/dL (ref 32.0–36.0)
MCV: 86.9 fL (ref 80.0–100.0)
MPV: 9.6 fL (ref 7.5–12.5)
Monocytes Relative: 8.9 %
Neutro Abs: 5546 cells/uL (ref 1500–7800)
Neutrophils Relative %: 47.4 %
Platelets: 364 10*3/uL (ref 140–400)
RBC: 5.33 10*6/uL (ref 4.20–5.80)
RDW: 13.5 % (ref 11.0–15.0)
Total Lymphocyte: 42.6 %
WBC: 11.7 10*3/uL — ABNORMAL HIGH (ref 3.8–10.8)

## 2021-05-21 LAB — COMPLETE METABOLIC PANEL WITH GFR
AG Ratio: 1.6 (calc) (ref 1.0–2.5)
ALT: 28 U/L (ref 9–46)
AST: 20 U/L (ref 10–40)
Albumin: 4.2 g/dL (ref 3.6–5.1)
Alkaline phosphatase (APISO): 56 U/L (ref 36–130)
BUN: 12 mg/dL (ref 7–25)
CO2: 26 mmol/L (ref 20–32)
Calcium: 9.3 mg/dL (ref 8.6–10.3)
Chloride: 104 mmol/L (ref 98–110)
Creat: 0.94 mg/dL (ref 0.60–1.29)
Globulin: 2.7 g/dL (calc) (ref 1.9–3.7)
Glucose, Bld: 78 mg/dL (ref 65–99)
Potassium: 3.9 mmol/L (ref 3.5–5.3)
Sodium: 138 mmol/L (ref 135–146)
Total Bilirubin: 0.5 mg/dL (ref 0.2–1.2)
Total Protein: 6.9 g/dL (ref 6.1–8.1)
eGFR: 103 mL/min/{1.73_m2} (ref >=60)

## 2021-05-21 LAB — SEDIMENTATION RATE: Sed Rate: 2 mm/h (ref 0–15)

## 2021-05-21 LAB — URIC ACID: Uric Acid, Serum: 8.1 mg/dL — ABNORMAL HIGH (ref 4.0–8.0)

## 2021-05-21 NOTE — Progress Notes (Signed)
Blood count and liver and kidney function look fine to continue current medicines as planned. Uric acid is 8.1 which is above the goal to be less than 6. We can recheck in a month taking the uloric 40 mg once daily to see how much improvement there is. Okay to use the colchicine in the meantime.

## 2021-06-19 ENCOUNTER — Other Ambulatory Visit: Payer: Self-pay | Admitting: Internal Medicine

## 2021-06-19 DIAGNOSIS — M10071 Idiopathic gout, right ankle and foot: Secondary | ICD-10-CM

## 2021-06-19 NOTE — Telephone Encounter (Signed)
Next Visit: 06/26/2021  Last Visit: 05/20/2021  Last Fill: 05/20/2021 ( 30 day supply)  DX: Idiopathic gout of right foot, unspecified chronicity   Current Dose per office note 05/20/2021: febuxostat 40 mg p.o. daily   Labs: 05/20/2021 Blood count and liver and kidney function look fine to continue current medicines as planned. Uric acid is 8.1   Okay to refill Uloric?

## 2021-06-25 NOTE — Progress Notes (Signed)
Office Visit Note  Patient: Richard Berger             Date of Birth: July 20, 1976           MRN: [id number]             PCP: Jackie Plum, MD Referring: Jackie Plum, MD Visit Date: 06/26/2021   Subjective:  Follow-up (Patient complains of continued joint pain with some improvement while taking Uloric and Colchicine. )   History of Present Illness: Richard Berger is a 45 y.o. male here for follow up for chronic gout currently on uloric 40 mg daily and colchicine 0.6 mg daily.  After about 2 weeks taking the Uloric he experienced a flare of gout pain in the left foot also some pain in the left knee.  He discontinued the medicine to resume taking the colchicine with improvement of symptoms.  He was concerned about taking both medicines at the same time.  Currently still has a small residual symptoms in the great toe.  Previous HPI 05/20/21 Richard Berger is a 45 y.o. male here for recurrent gout and positive ANA. His primary care office has seen him for podagra treated with prednisone and colchicine tablets.  He was also started apparently on Uloric 40 mg p.o. daily for hyperuricemia but states he is not taking the medicine since a few days ago.  He has had continued joint pain in his hands and knees and feet the most recent active swelling and severe pain was in the right great toe.  He has had infrequent swelling of the left great toe and has had involvement of bilateral knees he denies any obvious episodes of swelling inflammation involving the upper extremities.  From his history it is not very clear his exact previous treatments he is taken multiple medications including diclofenac, prednisone, colchicine, and topical Voltaren for joint inflammation but is currently off the colchicine.  He reports typically noticing improvement while taking this but rapid return of symptoms after discontinuation.   Labs reviewed 03/2021 ANA 1:40 cytoplasmic RF neg CCP neg  Review of Systems   Constitutional:  Positive for fatigue.  HENT:  Negative for mouth sores, mouth dryness and nose dryness.   Eyes:  Negative for pain, itching, visual disturbance and dryness.  Respiratory:  Negative for cough, hemoptysis, shortness of breath and difficulty breathing.   Cardiovascular:  Negative for chest pain, palpitations and swelling in legs/feet.  Gastrointestinal:  Negative for abdominal pain, blood in stool, constipation and diarrhea.  Endocrine: Negative for increased urination.  Genitourinary:  Negative for painful urination.  Musculoskeletal:  Positive for joint pain, joint pain, joint swelling and morning stiffness. Negative for myalgias, muscle weakness, muscle tenderness and myalgias.  Skin:  Negative for color change, rash and redness.  Allergic/Immunologic: Negative for susceptible to infections.  Neurological:  Negative for dizziness, numbness, headaches, memory loss and weakness.  Hematological:  Negative for swollen glands.  Psychiatric/Behavioral:  Negative for confusion and sleep disturbance.    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  No pertinent family history. Past Surgical History:  Procedure Laterality Date   EYE SURGERY Right    Cataract   Social History   Social History Narrative   Marital status: single; dating. Moved from [place] to [place] in 2001 with his father.  Family (1 brother and 1 sister) in [place]. Mother died there 02-01-13.      Children: one son in [place].      Employment: employed      Tobacco: 1-2 cigarettes a  week.      Alcohol: alcohol on weekends.        Immunization History  Administered Date(s) Administered   Tdap 04/28/2014     Objective: Vital Signs: BP 102/65 (BP Location: Left Arm, Patient Position: Sitting, Cuff Size: Normal)   Pulse 72   Ht 5\' 5"  (1.651 m)   Wt 240 lb (108.9 kg)   BMI 39.94 kg/m    Physical Exam Constitutional:      Appearance: He is obese.  Skin:    General: Skin is warm and dry.  Neurological:     Mental Status: He is alert.  Psychiatric:        Mood and Affect: Mood normal.     Musculoskeletal Exam:  Elbows full ROM no tenderness or swelling Wrists full ROM no tenderness or swelling Fingers full ROM no tenderness or swelling Knees full ROM no tenderness or swelling Left 1st MTP warmth, tenderness, mild swelling   Investigation: No additional findings.  Imaging: No results found.  Recent Labs: Lab Results  Component Value Date   WBC 11.7 (H) 05/20/2021   HGB 15.7 05/20/2021   PLT 364 05/20/2021   NA 138 05/20/2021   K 3.9 05/20/2021   CL 104 05/20/2021   CO2 26 05/20/2021   GLUCOSE 78 05/20/2021   BUN 12 05/20/2021   CREATININE 0.94 05/20/2021   BILITOT 0.5 05/20/2021   ALKPHOS 76 08/19/2018   AST 20 05/20/2021   ALT 28 05/20/2021   PROT 6.9 05/20/2021   ALBUMIN 4.2 08/19/2018   CALCIUM 9.3 05/20/2021   GFRAA 108 08/19/2018    Speciality Comments: No specialty comments available.  Procedures:  No procedures performed Allergies: Patient has no known allergies.   Assessment / Plan:     Visit Diagnoses: Idiopathic gout of right foot, unspecified chronicity - Plan: febuxostat (ULORIC) 40 MG tablet, colchicine (COLCRYS) 0.6 MG tablet, Uric acid, COMPLETE METABOLIC PANEL WITH GFR  Chronic gout previously described more symptoms in the right side but since her last visit had flareup in the left foot location is typical for podagra. I believe he did not understand the recommendations very clearly, so we discussed this to resume  the uloric 40 mg every day regardless of symptoms and he can take the colchicine 0.6 mg 1 or 2 tablets per day as needed for symptoms, and safe to take both concurrently. Not checking uric acid today as he is off the medication, recommend labs only f/u next month.  Orders: Orders Placed This Encounter  Procedures   Uric acid   COMPLETE METABOLIC PANEL WITH GFR    Meds ordered this encounter  Medications   febuxostat (ULORIC) 40 MG tablet    Sig: TAKE 1 TABLET(40 MG) BY MOUTH DAILY    Dispense:  30 tablet    Refill:  0   colchicine (COLCRYS) 0.6 MG tablet    Sig: Take 1 tablet (0.6 mg total) by mouth 2 (two) times daily as needed.  Dispense:  60 tablet    Refill:  0      Follow-Up Instructions: No follow-ups on file.   Fuller Plan, MD  Note - This record has been created using [employer].  Chart creation errors have been sought, but may not always  have been located. Such creation errors do not reflect on  the standard of medical care.

## 2021-06-26 ENCOUNTER — Encounter: Payer: Self-pay | Admitting: Internal Medicine

## 2021-06-26 ENCOUNTER — Ambulatory Visit: Payer: No Typology Code available for payment source | Admitting: Internal Medicine

## 2021-06-26 ENCOUNTER — Other Ambulatory Visit: Payer: Self-pay

## 2021-06-26 VITALS — BP 102/65 | HR 72 | Ht 65.0 in | Wt 240.0 lb

## 2021-06-26 DIAGNOSIS — M10071 Idiopathic gout, right ankle and foot: Secondary | ICD-10-CM

## 2021-06-26 MED ORDER — COLCHICINE 0.6 MG PO TABS: 0.6000 mg | ORAL_TABLET | Freq: Two times a day (BID) | ORAL | 0 refills | Status: DC | PRN

## 2021-06-26 MED ORDER — FEBUXOSTAT 40 MG PO TABS: ORAL_TABLET | ORAL | 0 refills | Status: DC

## 2021-07-27 ENCOUNTER — Telehealth: Payer: Self-pay | Admitting: Internal Medicine

## 2021-07-27 ENCOUNTER — Other Ambulatory Visit: Payer: Self-pay | Admitting: *Deleted

## 2021-07-27 DIAGNOSIS — M10071 Idiopathic gout, right ankle and foot: Secondary | ICD-10-CM

## 2021-07-27 NOTE — Telephone Encounter (Signed)
Patient would like to know when to make a follow up appointment? Patient was herre for lab draw today. Patient prefers Fridays. Please advise.

## 2021-07-28 LAB — URIC ACID: Uric Acid, Serum: 9 mg/dL — ABNORMAL HIGH (ref 4.0–8.0)

## 2021-07-28 LAB — COMPLETE METABOLIC PANEL WITH GFR
AG Ratio: 1.6 (calc) (ref 1.0–2.5)
ALT: 24 U/L (ref 9–46)
AST: 20 U/L (ref 10–40)
Albumin: 4 g/dL (ref 3.6–5.1)
Alkaline phosphatase (APISO): 54 U/L (ref 36–130)
BUN: 13 mg/dL (ref 7–25)
CO2: 27 mmol/L (ref 20–32)
Calcium: 9.3 mg/dL (ref 8.6–10.3)
Chloride: 103 mmol/L (ref 98–110)
Creat: 1.05 mg/dL (ref 0.60–1.29)
Globulin: 2.5 g/dL (calc) (ref 1.9–3.7)
Glucose, Bld: 86 mg/dL (ref 65–99)
Potassium: 4 mmol/L (ref 3.5–5.3)
Sodium: 138 mmol/L (ref 135–146)
Total Bilirubin: 0.7 mg/dL (ref 0.2–1.2)
Total Protein: 6.5 g/dL (ref 6.1–8.1)
eGFR: 90 mL/min/{1.73_m2} (ref >=60)

## 2021-07-29 MED ORDER — FEBUXOSTAT 80 MG PO TABS: 80.0000 mg | ORAL_TABLET | Freq: Every day | ORAL | 1 refills | Status: DC

## 2021-07-29 NOTE — Telephone Encounter (Signed)
Just addressed problem in associated result note.

## 2021-07-29 NOTE — Progress Notes (Signed)
Uric acid level is 9.0 still high. If he hs bene taking the uloric 40 mg  1 tablet every day we will need to increase the dose to 80 mg daily. I will send a new prescription for this and he can take 2 of the current ones per day for now until they are finished. We can scheduled follow up in 4-8 weeks to recheck the progress.

## 2021-09-17 NOTE — Progress Notes (Signed)
Office Visit Note  Patient: Richard Berger             Date of Birth: Jun 13, 1976           MRN: [id number]             PCP: Benito Mccreedy, MD Referring: Benito Mccreedy, MD Visit Date: 09/18/2021   Subjective:  Follow-up (Improving)   History of Present Illness: Richard Berger is a 45 y.o. male here for follow up for chronic gout after increasing uloric to 80 mg daily and colchicine 0.6 mg daily. Symptoms are well controlled without any major flares or attacks. He has developed some swelling around the left ankle not painful. No side effects noticeable with the medication increase.  Previous HPI 06/26/21 Richard Berger is a 45 y.o. male here for follow up for chronic gout currently on uloric 40 mg daily and colchicine 0.6 mg daily.  After about 2 weeks taking the Uloric he experienced a flare of gout pain in the left foot also some pain in the left knee.  He discontinued the medicine to resume taking the colchicine with improvement of symptoms.  He was concerned about taking both medicines at the same time.  Currently still has a small residual symptoms in the great toe.   Previous HPI 05/20/21 Richard Berger is a 45 y.o. male here for recurrent gout and positive ANA. His primary care office has seen him for podagra treated with prednisone and colchicine tablets.  He was also started apparently on Uloric 40 mg p.o. daily for hyperuricemia but states he is not taking the medicine since a few days ago.  He has had continued joint pain in his hands and knees and feet the most recent active swelling and severe pain was in the right great toe.  He has had infrequent swelling of the left great toe and has had involvement of bilateral knees he denies any obvious episodes of swelling inflammation involving the upper extremities.  From his history it is not very clear his exact previous treatments he is taken multiple medications including diclofenac, prednisone, colchicine, and topical Voltaren for  joint inflammation but is currently off the colchicine.  He reports typically noticing improvement while taking this but rapid return of symptoms after discontinuation.   Labs reviewed 03/2021 ANA 1:40 cytoplasmic RF neg CCP neg   Review of Systems  Constitutional:  Negative for fatigue.  HENT:  Negative for mouth dryness.   Eyes:  Negative for dryness.  Respiratory:  Negative for shortness of breath.   Cardiovascular:  Positive for swelling in legs/feet.  Gastrointestinal:  Negative for constipation.  Endocrine: Negative for excessive thirst.  Genitourinary:  Negative for difficulty urinating.  Musculoskeletal:  Positive for joint pain, joint pain, joint swelling and morning stiffness.  Skin:  Negative for rash.  Allergic/Immunologic: Negative for susceptible to infections.  Neurological:  Negative for weakness.  Hematological:  Negative for bruising/bleeding tendency.  Psychiatric/Behavioral:  Negative for sleep disturbance.    PMFS History:  Patient Active Problem List   Diagnosis Date Noted   Bilateral hand pain 05/20/2021   Acute bronchitis 12/01/2016   Pleurisy 12/01/2016   OSA (obstructive sleep apnea) 05/06/2016   Erectile dysfunction 05/06/2016   Morbid obesity ([place]) 05/02/2015   Obesity 01/17/2015   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 No pertinent family  history. Past Surgical History:  Procedure Laterality Date   EYE SURGERY Right    Cataract   Social History   Social History Narrative   Marital status: single; dating. Moved from [place] to [place] in 2001 with his father.  Family (1 brother and 1 sister) in [place]. Mother died there 2013-02-07.      Children: one son in [place].      Employment: employed      Tobacco: 1-2 cigarettes a week.      Alcohol: alcohol on weekends.        Immunization History  Administered Date(s)  Administered   [employer] Vaccination 02/13/2020, 03/12/2020, 08/14/2021   Tdap 04/28/2014     Objective: Vital Signs: BP 124/86 (BP Location: Left Arm, Patient Position: Sitting, Cuff Size: Normal)   Pulse 80   Resp 16   Ht _0  (1.651 m)   Wt 234 lb (106.1 kg)   BMI 38.94 kg/m    Physical Exam Constitutional:      Appearance: He is obese.  Cardiovascular:     Rate and Rhythm: Normal rate and regular rhythm.  Pulmonary:     Effort: Pulmonary effort is normal.     Breath sounds: Normal breath sounds.  Skin:    General: Skin is warm and dry.     Comments: Very thick dry skin on right heel, without any blistering or ulceration  Neurological:     Mental Status: He is alert.     Musculoskeletal Exam:  Elbows full ROM no tenderness or swelling Wrists full ROM no tenderness or swelling Fingers full ROM no tenderness or swelling Knees full ROM no tenderness or swelling Ankles no tenderness or swelling, cyst on medial aspect of left ankle MTPs full ROM no tenderness or swelling   Investigation: No additional findings.  Imaging: No results found.  Recent Labs: Lab Results  Component Value Date   WBC 11.7 (H) 05/20/2021   HGB 15.7 05/20/2021   PLT 364 05/20/2021   NA 138 07/27/2021   K 4.0 07/27/2021   CL 103 07/27/2021   CO2 27 07/27/2021   GLUCOSE 86 07/27/2021   BUN 13 07/27/2021   CREATININE 1.05 07/27/2021   BILITOT 0.7 07/27/2021   ALKPHOS 76 08/19/2018   AST 20 07/27/2021   ALT 24 07/27/2021   PROT 6.5 07/27/2021   ALBUMIN 4.2 08/19/2018   CALCIUM 9.3 07/27/2021   GFRAA 108 08/19/2018    Speciality Comments: No specialty comments available.  Procedures:  No procedures performed Allergies: Patient has no known allergies.   Assessment / Plan:     Visit Diagnoses: Idiopathic gout of right foot, unspecified chronicity - Plan: Febuxostat 80 MG TABS, colchicine (COLCRYS) 0.6 MG tablet, Uric acid, Sedimentation rate, COMPLETE METABOLIC PANEL  WITH GFR  No flares on current regimen. Continue uloric 80 mg PO daily and colchicine 0.6 mg PO daily. Checking uric acid, ESR, and CMP today for treatment monitoring. If uric acid is below 6 then after next follow up if doing well plan to change to colchicine PRN. Left foot cyst appearing change probably coming from ankle inflammation, just observing for now if it becomes painful we could treat with aspiration.  Orders: Orders Placed This Encounter  Procedures   Uric acid   Sedimentation rate   COMPLETE METABOLIC PANEL WITH GFR    Meds ordered this encounter  Medications   Febuxostat 80 MG TABS    Sig: Take 1 tablet (80 mg total) by  mouth daily.    Dispense:  30 tablet    Refill:  2   colchicine (COLCRYS) 0.6 MG tablet    Sig: Take 1 tablet (0.6 mg total) by mouth daily.    Dispense:  30 tablet    Refill:  2      Follow-Up Instructions: Return in about 3 months (around 12/19/2021) for Gout uloric+colchicine f/u 64mo.   CCollier Salina MD  Note - This record has been created using [employer]  Chart creation errors have been sought, but may not always  have been located. Such creation errors do not reflect on  the standard of medical care.

## 2021-09-18 ENCOUNTER — Other Ambulatory Visit: Payer: Self-pay

## 2021-09-18 ENCOUNTER — Encounter: Payer: Self-pay | Admitting: Internal Medicine

## 2021-09-18 ENCOUNTER — Ambulatory Visit: Payer: No Typology Code available for payment source | Admitting: Internal Medicine

## 2021-09-18 VITALS — BP 124/86 | HR 80 | Resp 16 | Ht 65.0 in | Wt 234.0 lb

## 2021-09-18 DIAGNOSIS — M10071 Idiopathic gout, right ankle and foot: Secondary | ICD-10-CM

## 2021-09-18 MED ORDER — COLCHICINE 0.6 MG PO TABS: 0.6000 mg | ORAL_TABLET | Freq: Every day | ORAL | 2 refills | Status: DC

## 2021-09-18 MED ORDER — FEBUXOSTAT 80 MG PO TABS: 80.0000 mg | ORAL_TABLET | Freq: Every day | ORAL | 2 refills | Status: DC

## 2021-09-19 LAB — COMPLETE METABOLIC PANEL WITH GFR
AG Ratio: 1.6 (calc) (ref 1.0–2.5)
ALT: 22 U/L (ref 9–46)
AST: 19 U/L (ref 10–40)
Albumin: 4.3 g/dL (ref 3.6–5.1)
Alkaline phosphatase (APISO): 50 U/L (ref 36–130)
BUN: 7 mg/dL (ref 7–25)
CO2: 26 mmol/L (ref 20–32)
Calcium: 9.3 mg/dL (ref 8.6–10.3)
Chloride: 102 mmol/L (ref 98–110)
Creat: 0.8 mg/dL (ref 0.60–1.29)
Globulin: 2.7 g/dL (calc) (ref 1.9–3.7)
Glucose, Bld: 85 mg/dL (ref 65–99)
Potassium: 3.8 mmol/L (ref 3.5–5.3)
Sodium: 135 mmol/L (ref 135–146)
Total Bilirubin: 1 mg/dL (ref 0.2–1.2)
Total Protein: 7 g/dL (ref 6.1–8.1)
eGFR: 113 mL/min/{1.73_m2} (ref >=60)

## 2021-09-19 LAB — SEDIMENTATION RATE: Sed Rate: 2 mm/h (ref 0–15)

## 2021-09-19 LAB — URIC ACID: Uric Acid, Serum: 6.5 mg/dL (ref 4.0–8.0)

## 2021-09-20 NOTE — Progress Notes (Signed)
Uric acid is 6.5 this is slightly above goal but I recommend continuing the uloric 80 mg once daily for now. No problems with kidney or liver function on the medications.

## 2021-10-04 ENCOUNTER — Other Ambulatory Visit: Payer: Self-pay | Admitting: Internal Medicine

## 2021-10-04 DIAGNOSIS — M10071 Idiopathic gout, right ankle and foot: Secondary | ICD-10-CM

## 2021-12-17 NOTE — Progress Notes (Signed)
Office Visit Note  Patient: Richard Berger             Date of Birth: 01-Sep-1976           MRN: [id number]             PCP: Jackie Plum, MD Referring: Jackie Plum, MD Visit Date: 12/18/2021   Subjective:  Follow-up (Left foot pain and swelling)   History of Present Illness: Richard Berger is a 46 y.o. male here for follow up for chronic gout on uloric 80 mg daily and colchicine 0.6 mg daily. He has left foot pain and swelling ongoing for about 3 days. This started at the great toe but involving the medial side of the foot all the way to the ankle now and somewhat on plantar side. He is taking the colchicine which usually works for his flares but not resolved yet. He is interested in a steroid shot to resolve the current inflammation. He does have some pain on the bottom of the heel and foot pretty frequently that he attributes to walking on concrete surfaces for 10 hour shifts.  Previous HPI 09/18/21 Richard Berger is a 46 y.o. male here for follow up for chronic gout after increasing uloric to 80 mg daily and colchicine 0.6 mg daily. Symptoms are well controlled without any major flares or attacks. He has developed some swelling around the left ankle not painful. No side effects noticeable with the medication increase.    Previous HPI 05/20/21 Richard Berger is a 46 y.o. male here for recurrent gout and positive ANA. His primary care office has seen him for podagra treated with prednisone and colchicine tablets.  He was also started apparently on Uloric 40 mg p.o. daily for hyperuricemia but states he is not taking the medicine since a few days ago.  He has had continued joint pain in his hands and knees and feet the most recent active swelling and severe pain was in the right great toe.  He has had infrequent swelling of the left great toe and has had involvement of bilateral knees he denies any obvious episodes of swelling inflammation involving the upper extremities.  From his  history it is not very clear his exact previous treatments he is taken multiple medications including diclofenac, prednisone, colchicine, and topical Voltaren for joint inflammation but is currently off the colchicine.  He reports typically noticing improvement while taking this but rapid return of symptoms after discontinuation.   Review of Systems  Constitutional:  Negative for fatigue.  HENT:  Negative for mouth dryness.   Eyes:  Negative for dryness.  Respiratory:  Negative for shortness of breath.   Cardiovascular:  Positive for swelling in legs/feet.  Gastrointestinal:  Negative for constipation.  Endocrine: Negative for excessive thirst.  Genitourinary:  Negative for difficulty urinating.  Musculoskeletal:  Positive for joint pain, joint pain, joint swelling and muscle tenderness.  Skin:  Negative for rash.  Allergic/Immunologic: Negative for susceptible to infections.  Neurological:  Negative for numbness.  Hematological:  Negative for bruising/bleeding tendency.  Psychiatric/Behavioral:  Negative for sleep disturbance.    PMFS History:  Patient Active Problem List   Diagnosis Date Noted   Plantar fasciitis 12/18/2021   Chronic gouty arthritis 12/18/2021   Chronic idiopathic constipation 12/18/2021   Gastroesophageal reflux disease 12/18/2021   Internal hemorrhoids 12/18/2021   Lumbar spondylosis 12/18/2021   Mild persistent asthma, uncomplicated 12/18/2021   Other seasonal allergic rhinitis 12/18/2021   Tobacco abuse 12/18/2021  Vitamin D deficiency 12/18/2021   Bilateral hand pain 05/20/2021   Acute bronchitis 12/01/2016   Pleurisy 12/01/2016   OSA (obstructive sleep apnea) 05/06/2016   Erectile dysfunction 05/06/2016   Morbid obesity ([facility]) 05/02/2015   Obesity 01/17/2015   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 No pertinent family history. Past Surgical History:   Procedure Laterality Date   EYE SURGERY Right    Cataract   Social History   Social History Narrative   Marital status: single; dating. Moved from [place] to [place] in 2001 with his father.  Family (1 brother and 1 sister) in [place]. Mother died there Jan 28, 2013.      Children: one son in [place].      Employment: employed      Tobacco: 1-2 cigarettes a week.      Alcohol: alcohol on weekends.        Immunization History  Administered Date(s) Administered   Influenza, Seasonal, Injecte, Preservative Fre 08/31/2018, 08/02/2019   Moderna Sars-Covid-2 Vaccination 02/13/2020, 03/12/2020, 08/14/2021   Tdap 04/28/2014     Objective: Vital Signs: BP 126/85 (BP Location: Left Arm, Patient Position: Sitting, Cuff Size: Normal)    Pulse 85    Resp 17    Ht 5\' 5"  (1.651 m)    Wt 246 lb (111.6 kg)    BMI 40.94 kg/m    Physical Exam Constitutional:      Appearance: He is obese.  Musculoskeletal:     Right lower leg: No edema.     Left lower leg: No edema.  Skin:    General: Skin is warm and dry.     Findings: No rash.  Neurological:     Mental Status: He is alert.  Psychiatric:        Mood and Affect: Mood normal.     Musculoskeletal Exam:  Elbows full ROM no tenderness or swelling Wrists full ROM no tenderness or swelling Fingers full ROM no tenderness or swelling Knees full ROM no tenderness or swelling Left ankle ROM is intact, with medial side tenderness to pressure at and below medial malleolus, tenderness along medial and plantar edge of the foot extending to 1st MTP, 1st MTP pain with movement and decreased plantarflexion ROM    Investigation: No additional findings.  Imaging: No results found.  Recent Labs: Lab Results  Component Value Date   WBC 11.7 (H) 05/20/2021   HGB 15.7 05/20/2021   PLT 364 05/20/2021   NA 135 09/18/2021   K 3.8 09/18/2021   CL 102 09/18/2021   CO2 26 09/18/2021   GLUCOSE 85 09/18/2021   BUN 7 09/18/2021    CREATININE 0.80 09/18/2021   BILITOT 1.0 09/18/2021   ALKPHOS 76 08/19/2018   AST 19 09/18/2021   ALT 22 09/18/2021   PROT 7.0 09/18/2021   ALBUMIN 4.2 08/19/2018   CALCIUM 9.3 09/18/2021   GFRAA 108 08/19/2018    Speciality Comments: No specialty comments available.  Procedures:  Medium Joint Inj: L ankle on 12/18/2021 11:20 AM Indications: pain and joint swelling Details: 25 G 1.5 in needle, anteromedial approach Medications: 2 mL lidocaine 1 %; 40 mg triamcinolone acetonide 40 MG/ML Outcome: tolerated well, no immediate complications Procedure, treatment alternatives, risks and benefits explained, specific risks discussed. Consent was given by the patient. Immediately prior to procedure a time out was called to verify  the correct patient, procedure, equipment, support staff and site/side marked as required. Patient was prepped and draped in the usual sterile fashion.    Allergies: Patient has no known allergies.   Assessment / Plan:     Visit Diagnoses: Idiopathic chronic gout of foot without tophus, unspecified laterality - Plan: Uric acid  Overall doing well large improvement in flares but still having foot pain intermittently. Currently left side. His uric acid was not quite in goal range will recheck this today. He would like to avoid more medicines so unless significantly worse no changes planned. If further above goal can consider adding probenecid. Steroid injection in left ankle today for current flare not cleared up in 3 days with colchicine so far. Plan to continue uloric 80 mg daily and colchicine 0.6 mg PRN.  Plantar fasciitis  I suspect contributing to his foot pain on plantar side. Provided ROM exercises print out today for this recommend starting once current flare improves.  Orders: Orders Placed This Encounter  Procedures   Medium Joint Inj: L ankle   Uric acid   Meds ordered this encounter  Medications   colchicine (COLCRYS) 0.6 MG tablet    Sig: Take 1  tablet (0.6 mg total) by mouth daily.    Dispense:  30 tablet    Refill:  5   Febuxostat 80 MG TABS    Sig: Take 1 tablet (80 mg total) by mouth daily.    Dispense:  90 tablet    Refill:  3     Follow-Up Instructions: Return in about 1 year (around 12/18/2022) for Gout on uloric/colchicine prn f/u 56yr.   Fuller Plan, MD  Note - This record has been created using [employer].  Chart creation errors have been sought, but may not always  have been located. Such creation errors do not reflect on  the standard of medical care.

## 2021-12-18 ENCOUNTER — Ambulatory Visit (INDEPENDENT_AMBULATORY_CARE_PROVIDER_SITE_OTHER): Payer: PRIVATE HEALTH INSURANCE | Admitting: Internal Medicine

## 2021-12-18 ENCOUNTER — Encounter: Payer: Self-pay | Admitting: Internal Medicine

## 2021-12-18 ENCOUNTER — Other Ambulatory Visit: Payer: Self-pay

## 2021-12-18 VITALS — BP 126/85 | HR 85 | Resp 17 | Ht 65.0 in | Wt 246.0 lb

## 2021-12-18 DIAGNOSIS — K219 Gastro-esophageal reflux disease without esophagitis: Secondary | ICD-10-CM | POA: Insufficient documentation

## 2021-12-18 DIAGNOSIS — K5904 Chronic idiopathic constipation: Secondary | ICD-10-CM | POA: Insufficient documentation

## 2021-12-18 DIAGNOSIS — Z720 Tobacco use: Secondary | ICD-10-CM | POA: Insufficient documentation

## 2021-12-18 DIAGNOSIS — E559 Vitamin D deficiency, unspecified: Secondary | ICD-10-CM | POA: Insufficient documentation

## 2021-12-18 DIAGNOSIS — M722 Plantar fascial fibromatosis: Secondary | ICD-10-CM | POA: Insufficient documentation

## 2021-12-18 DIAGNOSIS — M47816 Spondylosis without myelopathy or radiculopathy, lumbar region: Secondary | ICD-10-CM | POA: Insufficient documentation

## 2021-12-18 DIAGNOSIS — J4530 Mild persistent asthma, uncomplicated: Secondary | ICD-10-CM | POA: Insufficient documentation

## 2021-12-18 DIAGNOSIS — M1A0790 Idiopathic chronic gout, unspecified ankle and foot, without tophus (tophi): Secondary | ICD-10-CM | POA: Diagnosis not present

## 2021-12-18 DIAGNOSIS — M1A00X0 Idiopathic chronic gout, unspecified site, without tophus (tophi): Secondary | ICD-10-CM | POA: Insufficient documentation

## 2021-12-18 DIAGNOSIS — M10071 Idiopathic gout, right ankle and foot: Secondary | ICD-10-CM | POA: Diagnosis not present

## 2021-12-18 DIAGNOSIS — K648 Other hemorrhoids: Secondary | ICD-10-CM | POA: Insufficient documentation

## 2021-12-18 DIAGNOSIS — J302 Other seasonal allergic rhinitis: Secondary | ICD-10-CM | POA: Insufficient documentation

## 2021-12-18 LAB — URIC ACID: Uric Acid, Serum: 7.8 mg/dL (ref 4.0–8.0)

## 2021-12-18 MED ORDER — LIDOCAINE HCL 1 % IJ SOLN
2.0000 mL | INTRAMUSCULAR | Status: AC | PRN
  Administered 2021-12-18: 2 mL

## 2021-12-18 MED ORDER — TRIAMCINOLONE ACETONIDE 40 MG/ML IJ SUSP
40.0000 mg | INTRAMUSCULAR | Status: AC | PRN
  Administered 2021-12-18: 40 mg via INTRA_ARTICULAR

## 2021-12-18 MED ORDER — COLCHICINE 0.6 MG PO TABS: 0.6000 mg | ORAL_TABLET | Freq: Every day | ORAL | 5 refills | Status: DC

## 2021-12-18 MED ORDER — FEBUXOSTAT 80 MG PO TABS: 80.0000 mg | ORAL_TABLET | Freq: Every day | ORAL | 3 refills | Status: DC

## 2021-12-21 NOTE — Progress Notes (Signed)
Uric acid is 7.8 which is still above goal despite the 80 mg uloric. I think we can just monitor for now. Let's see how many flares he keeps getting if it is more than every few months I would recommend we discuss adding another maintenance medicine.

## 2022-07-22 ENCOUNTER — Other Ambulatory Visit: Payer: Self-pay | Admitting: Internal Medicine

## 2022-07-22 DIAGNOSIS — M10071 Idiopathic gout, right ankle and foot: Secondary | ICD-10-CM

## 2022-07-22 MED ORDER — COLCHICINE 0.6 MG PO TABS: 0.6000 mg | ORAL_TABLET | Freq: Every day | ORAL | 3 refills | Status: DC | PRN

## 2022-07-22 NOTE — Telephone Encounter (Signed)
Patient called requesting prescription refill of Colchicine to be sent to [facility] at [address]

## 2022-07-22 NOTE — Telephone Encounter (Signed)
Next Visit: 03/10/2022  Last Visit: 12/18/2021  Last Fill: 12/18/2021  DX: Idiopathic chronic gout of foot without tophus, unspecified laterality   Current Dose per office note 12/18/2021: colchicine 0.6 mg PRN  Labs: 12/18/2021 Uric acid is 7.8 which is still above goal despite the 80 mg uloric. I think we can just monitor for now. Let's see how many flares he keeps getting if it is more than every few months I would recommend we discuss adding another maintenance medicine.  Okay to refill cochicine?

## 2023-03-10 NOTE — Progress Notes (Deleted)
Office Visit Note  Patient: Richard Berger             Date of Birth: Jan 20, 1976           MRN: [id number]             PCP: Jackie Plum, MD Referring: Jackie Plum, MD Visit Date: 03/11/2023   Subjective:  No chief complaint on file.   History of Present Illness: Richard Berger is a 47 y.o. male here for follow up ***   Previous HPI 12/18/21 Richard Berger is a 47 y.o. male here for follow up for chronic gout on uloric 80 mg daily and colchicine 0.6 mg daily. He has left foot pain and swelling ongoing for about 3 days. This started at the great toe but involving the medial side of the foot all the way to the ankle now and somewhat on plantar side. He is taking the colchicine which usually works for his flares but not resolved yet. He is interested in a steroid shot to resolve the current inflammation. He does have some pain on the bottom of the heel and foot pretty frequently that he attributes to walking on concrete surfaces for 10 hour shifts.   Previous HPI 09/18/21 Richard Berger is a 47 y.o. male here for follow up for chronic gout after increasing uloric to 80 mg daily and colchicine 0.6 mg daily. Symptoms are well controlled without any major flares or attacks. He has developed some swelling around the left ankle not painful. No side effects noticeable with the medication increase.    Previous HPI 05/20/21 Richard Berger is a 47 y.o. male here for recurrent gout and positive ANA. His primary care office has seen him for podagra treated with prednisone and colchicine tablets.  He was also started apparently on Uloric 40 mg p.o. daily for hyperuricemia but states he is not taking the medicine since a few days ago.  He has had continued joint pain in his hands and knees and feet the most recent active swelling and severe pain was in the right great toe.  He has had infrequent swelling of the left great toe and has had involvement of bilateral knees he denies any obvious episodes  of swelling inflammation involving the upper extremities.  From his history it is not very clear his exact previous treatments he is taken multiple medications including diclofenac, prednisone, colchicine, and topical Voltaren for joint inflammation but is currently off the colchicine.  He reports typically noticing improvement while taking this but rapid return of symptoms after discontinuation.   No Rheumatology ROS completed.   PMFS History:  Patient Active Problem List   Diagnosis Date Noted   Plantar fasciitis 12/18/2021   Chronic gouty arthritis 12/18/2021   Chronic idiopathic constipation 12/18/2021   Gastroesophageal reflux disease 12/18/2021   Internal hemorrhoids 12/18/2021   Lumbar spondylosis 12/18/2021   Mild persistent asthma, uncomplicated 12/18/2021   Other seasonal allergic rhinitis 12/18/2021   Tobacco abuse 12/18/2021   Vitamin D deficiency 12/18/2021   Bilateral hand pain 05/20/2021   Acute bronchitis 12/01/2016   Pleurisy 12/01/2016   OSA (obstructive sleep apnea) 05/06/2016   Erectile dysfunction 05/06/2016   Morbid obesity ([facility]) 05/02/2015   Obesity 01/17/2015   Post corneal transplant 04/28/2014   Gout 06/09/2012    Past Medical History:  Diagnosis Date   Cataract    Gout     No family history on file. Past Surgical History:  Procedure Laterality Date  EYE SURGERY Right    Cataract   Social History   Social History Narrative   Marital status: single; dating. Moved from [place] to [place] in 2001 with his father.  Family (1 brother and 1 sister) in [place]. Mother died there 2013/02/10.      Children: one son in [place].      Employment: employed      Tobacco: 1-2 cigarettes a week.      Alcohol: alcohol on weekends.        Immunization History  Administered Date(s) Administered   Influenza, Seasonal, Injecte, Preservative Fre 08/31/2018, 08/02/2019   Moderna Sars-Covid-2 Vaccination 02/13/2020, 03/12/2020, 08/14/2021   Tdap  04/28/2014     Objective: Vital Signs: There were no vitals taken for this visit.   Physical Exam   Musculoskeletal Exam: ***  CDAI Exam: CDAI Score: -- Patient Global: --; Provider Global: -- Swollen: --; Tender: -- Joint Exam 03/11/2023   No joint exam has been documented for this visit   There is currently no information documented on the homunculus. Go to the Rheumatology activity and complete the homunculus joint exam.  Investigation: No additional findings.  Imaging: No results found.  Recent Labs: Lab Results  Component Value Date   WBC 11.7 (H) 05/20/2021   HGB 15.7 05/20/2021   PLT 364 05/20/2021   NA 135 09/18/2021   K 3.8 09/18/2021   CL 102 09/18/2021   CO2 26 09/18/2021   GLUCOSE 85 09/18/2021   BUN 7 09/18/2021   CREATININE 0.80 09/18/2021   BILITOT 1.0 09/18/2021   ALKPHOS 76 08/19/2018   AST 19 09/18/2021   ALT 22 09/18/2021   PROT 7.0 09/18/2021   ALBUMIN 4.2 08/19/2018   CALCIUM 9.3 09/18/2021   GFRAA 108 08/19/2018    Speciality Comments: No specialty comments available.  Procedures:  No procedures performed Allergies: Patient has no known allergies.   Assessment / Plan:     Visit Diagnoses: No diagnosis found.  ***  Orders: No orders of the defined types were placed in this encounter.  No orders of the defined types were placed in this encounter.    Follow-Up Instructions: No follow-ups on file.   Fuller Plan, MD  Note - This record has been created using [employer].  Chart creation errors have been sought, but may not always  have been located. Such creation errors do not reflect on  the standard of medical care.

## 2023-03-11 ENCOUNTER — Ambulatory Visit: Payer: PRIVATE HEALTH INSURANCE | Admitting: Internal Medicine

## 2023-03-31 NOTE — Progress Notes (Signed)
Office Visit Note  Patient: Richard Berger             Date of Birth: Jun 17, 1976           MRN: [id number]             PCP: Jackie Plum, MD Referring: Jackie Plum, MD Visit Date: 04/01/2023   Subjective:  Follow-up   History of Present Illness: Richard Berger is a 47 y.o. male here for follow up for chronic gout on Uloric 80 mg daily and colchicine 0.6 mg as needed.  It has been about a year since we followed up symptoms have mostly been well-controlled reports at least 2 flareups with pain and swelling involving the left foot.  Currently he has some inflammation this started about 2 weeks ago and has partially improved since onset.  Prior to this had another episode about 7 or 8 months prior that was treated with the colchicine.  He has not had persistent daily symptoms outside of these besides mild use related pain that normally does not have much associated swelling.  Previous HPI 12/18/21 Richard Berger is a 47 y.o. male here for follow up for chronic gout on uloric 80 mg daily and colchicine 0.6 mg daily. He has left foot pain and swelling ongoing for about 3 days. This started at the great toe but involving the medial side of the foot all the way to the ankle now and somewhat on plantar side. He is taking the colchicine which usually works for his flares but not resolved yet. He is interested in a steroid shot to resolve the current inflammation. He does have some pain on the bottom of the heel and foot pretty frequently that he attributes to walking on concrete surfaces for 10 hour shifts.   Previous HPI 09/18/21 Richard Berger is a 47 y.o. male here for follow up for chronic gout after increasing uloric to 80 mg daily and colchicine 0.6 mg daily. Symptoms are well controlled without any major flares or attacks. He has developed some swelling around the left ankle not painful. No side effects noticeable with the medication increase.    Previous HPI 05/20/21 Richard Berger is  a 47 y.o. male here for recurrent gout and positive ANA. His primary care office has seen him for podagra treated with prednisone and colchicine tablets.  He was also started apparently on Uloric 40 mg p.o. daily for hyperuricemia but states he is not taking the medicine since a few days ago.  He has had continued joint pain in his hands and knees and feet the most recent active swelling and severe pain was in the right great toe.  He has had infrequent swelling of the left great toe and has had involvement of bilateral knees he denies any obvious episodes of swelling inflammation involving the upper extremities.  From his history it is not very clear his exact previous treatments he is taken multiple medications including diclofenac, prednisone, colchicine, and topical Voltaren for joint inflammation but is currently off the colchicine.  He reports typically noticing improvement while taking this but rapid return of symptoms after discontinuation.   Review of Systems  Constitutional:  Negative for fatigue.  HENT:  Negative for mouth sores and mouth dryness.   Eyes:  Negative for dryness.  Respiratory:  Negative for shortness of breath.   Cardiovascular:  Negative for chest pain and palpitations.  Gastrointestinal:  Negative for blood in stool, constipation and diarrhea.  Endocrine:  Negative for increased urination.  Genitourinary:  Negative for involuntary urination.  Musculoskeletal:  Positive for joint swelling. Negative for joint pain, gait problem, joint pain, myalgias, muscle weakness, morning stiffness, muscle tenderness and myalgias.  Skin:  Negative for color change, rash, hair loss and sensitivity to sunlight.  Allergic/Immunologic: Negative for susceptible to infections.  Neurological:  Negative for dizziness and headaches.  Hematological:  Negative for swollen glands.  Psychiatric/Behavioral:  Negative for depressed mood and sleep disturbance. The patient is not nervous/anxious.      PMFS History:  Patient Active Problem List   Diagnosis Date Noted   Plantar fasciitis 12/18/2021   Chronic gouty arthritis 12/18/2021   Chronic idiopathic constipation 12/18/2021   Gastroesophageal reflux disease 12/18/2021   Internal hemorrhoids 12/18/2021   Lumbar spondylosis 12/18/2021   Mild persistent asthma, uncomplicated 12/18/2021   Other seasonal allergic rhinitis 12/18/2021   Tobacco abuse 12/18/2021   Vitamin D deficiency 12/18/2021   Bilateral hand pain 05/20/2021   Acute bronchitis 12/01/2016   Pleurisy 12/01/2016   OSA (obstructive sleep apnea) 05/06/2016   Erectile dysfunction 05/06/2016   Morbid obesity (HCC) 05/02/2015   Obesity 01/17/2015   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 No pertinent family history. Past Surgical History:  Procedure Laterality Date   EYE SURGERY Right    Cataract   Social History   Social History Narrative   Marital status: single; dating. Moved from [place] to [place] in 2001 with his father.  Family (1 brother and 1 sister) in [place]. Mother died there 02-01-13.      Children: one son in [place].      Employment: employed      Tobacco: 1-2 cigarettes a week.      Alcohol: alcohol on weekends.        Immunization History  Administered Date(s) Administered   Influenza, Seasonal, Injecte, Preservative Fre 08/31/2018, 08/02/2019   Moderna Sars-Covid-2 Vaccination 02/13/2020, 03/12/2020, 08/14/2021   Tdap 04/28/2014     Objective: Vital Signs: BP 123/85 (BP Location: Left Arm, Patient Position: Sitting, Cuff Size: Normal)   Pulse 86   Resp 16   Ht 5\' 5"  (1.651 m)   Wt 250 lb (113.4 kg)   BMI 41.60 kg/m    Physical Exam Constitutional:      Appearance: He is obese.  Eyes:     Conjunctiva/sclera: Conjunctivae normal.  Cardiovascular:     Rate and Rhythm: Normal rate and regular rhythm.   Pulmonary:     Effort: Pulmonary effort is normal.     Breath sounds: Normal breath sounds.  Lymphadenopathy:     Cervical: No cervical adenopathy.  Skin:    General: Skin is warm and dry.  Neurological:     Mental Status: He is alert.  Psychiatric:        Mood and Affect: Mood normal.      Musculoskeletal Exam:  Elbows full ROM no tenderness or swelling Wrists full ROM no tenderness or swelling Fingers full ROM no tenderness or swelling Knees full ROM no tenderness or swelling, patellofemoral crepitus present Ankles full ROM no tenderness, left ankle with 1+ overlying pitting edema Left 1st MTP joint tenderness, reduced ROM, bony nodule no palpable effusion, some tenderness of other MTPs without palpable synovitis  Investigation: No additional findings.  Imaging: No results found.  Recent Labs: Lab Results  Component Value Date   WBC 11.7 (H) 05/20/2021   HGB 15.7 05/20/2021   PLT 364 05/20/2021   NA 135 09/18/2021   K 3.8 09/18/2021   CL 102 09/18/2021   CO2 26 09/18/2021   GLUCOSE 85 09/18/2021   BUN 7 09/18/2021   CREATININE 0.80 09/18/2021   BILITOT 1.0 09/18/2021   ALKPHOS 76 08/19/2018   AST 19 09/18/2021   ALT 22 09/18/2021   PROT 7.0 09/18/2021   ALBUMIN 4.2 08/19/2018   CALCIUM 9.3 09/18/2021   GFRAA 108 08/19/2018    Speciality Comments: No specialty comments available.  Procedures:  No procedures performed Allergies: Patient has no known allergies.   Assessment / Plan:     Visit Diagnoses: Idiopathic chronic gout of foot without tophus, unspecified laterality - Plan: predniSONE (DELTASONE) 10 MG tablet, Uric acid, Sedimentation rate, COMPLETE METABOLIC PANEL WITH GFR  Gout is pretty well-controlled about 2 definite flareups over the past year so not entirely in remission but if labs okay would not push for further medication titration.  Has a bit of pain and swelling in the foot currently will prescribe another short oral prednisone taper  starting at 40 mg today.  Plan to continue febuxostat 80 mg daily unless uric acid severely above goal.  Colchicine 0.6 mg daily as needed just for flares.  Orders: Orders Placed This Encounter  Procedures   Uric acid   Sedimentation rate   COMPLETE METABOLIC PANEL WITH GFR   Meds ordered this encounter  Medications   Febuxostat 80 MG TABS    Sig: Take 1 tablet (80 mg total) by mouth daily.    Dispense:  90 tablet    Refill:  3   colchicine (COLCRYS) 0.6 MG tablet    Sig: Take 1 tablet (0.6 mg total) by mouth daily as needed.    Dispense:  30 tablet    Refill:  3   predniSONE (DELTASONE) 10 MG tablet    Sig: Take 4 tablets (40 mg total) by mouth daily with breakfast for 2 days, THEN 3 tablets (30 mg total) daily with breakfast for 2 days, THEN 2 tablets (20 mg total) daily with breakfast for 2 days, THEN 1 tablet (10 mg total) daily with breakfast for 2 days.    Dispense:  20 tablet    Refill:  0     Follow-Up Instructions: Return in about 1 year (around 03/31/2024) for Gout on uloric/colchicine f/u 10yr.   Fuller Plan, MD  Note - This record has been created using [employer].  Chart creation errors have been sought, but may not always  have been located. Such creation errors do not reflect on  the standard of medical care.

## 2023-04-01 ENCOUNTER — Ambulatory Visit: Payer: PRIVATE HEALTH INSURANCE | Attending: Internal Medicine | Admitting: Internal Medicine

## 2023-04-01 ENCOUNTER — Encounter: Payer: Self-pay | Admitting: Internal Medicine

## 2023-04-01 VITALS — BP 123/85 | HR 86 | Resp 16 | Ht 65.0 in | Wt 250.0 lb

## 2023-04-01 DIAGNOSIS — M1A0710 Idiopathic chronic gout, right ankle and foot, without tophus (tophi): Secondary | ICD-10-CM | POA: Diagnosis not present

## 2023-04-01 DIAGNOSIS — M1A0790 Idiopathic chronic gout, unspecified ankle and foot, without tophus (tophi): Secondary | ICD-10-CM

## 2023-04-01 DIAGNOSIS — M10071 Idiopathic gout, right ankle and foot: Secondary | ICD-10-CM

## 2023-04-01 MED ORDER — COLCHICINE 0.6 MG PO TABS: 0.6000 mg | ORAL_TABLET | Freq: Every day | ORAL | 3 refills | Status: DC | PRN

## 2023-04-01 MED ORDER — PREDNISONE 10 MG PO TABS: ORAL_TABLET | ORAL | 0 refills | Status: AC

## 2023-04-01 MED ORDER — FEBUXOSTAT 80 MG PO TABS: 80.0000 mg | ORAL_TABLET | Freq: Every day | ORAL | 3 refills | Status: DC

## 2023-04-02 LAB — COMPLETE METABOLIC PANEL WITH GFR
AG Ratio: 1.6 (calc) (ref 1.0–2.5)
ALT: 34 U/L (ref 9–46)
AST: 25 U/L (ref 10–40)
Albumin: 4.4 g/dL (ref 3.6–5.1)
Alkaline phosphatase (APISO): 79 U/L (ref 36–130)
BUN: 17 mg/dL (ref 7–25)
CO2: 26 mmol/L (ref 20–32)
Calcium: 9.8 mg/dL (ref 8.6–10.3)
Chloride: 105 mmol/L (ref 98–110)
Creat: 0.92 mg/dL (ref 0.60–1.29)
Globulin: 2.7 g/dL (calc) (ref 1.9–3.7)
Glucose, Bld: 69 mg/dL (ref 65–99)
Potassium: 4.1 mmol/L (ref 3.5–5.3)
Sodium: 140 mmol/L (ref 135–146)
Total Bilirubin: 0.4 mg/dL (ref 0.2–1.2)
Total Protein: 7.1 g/dL (ref 6.1–8.1)
eGFR: 105 mL/min/{1.73_m2} (ref >=60)

## 2023-04-02 LAB — URIC ACID: Uric Acid, Serum: 10 mg/dL — ABNORMAL HIGH (ref 4.0–8.0)

## 2023-04-02 LAB — SEDIMENTATION RATE: Sed Rate: 2 mm/h (ref 0–15)

## 2023-04-15 ENCOUNTER — Encounter: Payer: Self-pay | Admitting: *Deleted

## 2023-06-17 ENCOUNTER — Telehealth: Payer: Self-pay | Admitting: Internal Medicine

## 2023-06-17 NOTE — Telephone Encounter (Signed)
Patient contacted the office to request a medication refill.   1. Name of Medication: Prednisone  2. How are you currently taking this medication (dosage and times per day)? \N/A   3. What pharmacy would you like for that to be sent to? [facility]-   [employer]

## 2023-06-21 ENCOUNTER — Ambulatory Visit: Payer: PRIVATE HEALTH INSURANCE | Attending: Internal Medicine | Admitting: Internal Medicine

## 2023-06-21 VITALS — BP 118/80 | HR 97

## 2023-06-21 DIAGNOSIS — M10071 Idiopathic gout, right ankle and foot: Secondary | ICD-10-CM | POA: Diagnosis not present

## 2023-06-21 MED ORDER — LIDOCAINE HCL 1 % IJ SOLN
1.0000 mL | INTRAMUSCULAR | Status: AC | PRN
Start: 2023-06-21 — End: 2023-06-21
  Administered 2023-06-21: 1 mL

## 2023-06-21 MED ORDER — TRIAMCINOLONE ACETONIDE 40 MG/ML IJ SUSP
40.0000 mg | INTRAMUSCULAR | Status: AC | PRN
Start: 2023-06-21 — End: 2023-06-21
  Administered 2023-06-21: 40 mg via INTRA_ARTICULAR

## 2023-06-21 MED ORDER — PREDNISONE 20 MG PO TABS
ORAL_TABLET | ORAL | 0 refills | Status: AC
Start: 2023-06-22 — End: 2023-06-28

## 2023-06-21 MED ORDER — FEBUXOSTAT 80 MG PO TABS: 120.0000 mg | ORAL_TABLET | Freq: Every day | ORAL | Status: DC

## 2023-06-21 NOTE — Progress Notes (Signed)
   Procedure Note  Patient: Richard Berger             Date of Birth: Mar 25, 1976           MRN: [id number]             Visit Date: 06/21/2023  Procedures: Visit Diagnoses:  1. Idiopathic gout of right foot, unspecified chronicity     Medium Joint Inj: R ankle on 06/21/2023 3:50 PM Indications: pain and joint swelling Details: 25 G 1.5 in needle, anterior approach Medications: 1 mL lidocaine 1 %; 40 mg triamcinolone acetonide 40 MG/ML Procedure, treatment alternatives, risks and benefits explained, specific risks discussed. Consent was given by the patient. Immediately prior to procedure a time out was called to verify the correct patient, procedure, equipment, support staff and site/side marked as required. Patient was prepped and draped in the usual sterile fashion.     Patient here today for gout flare ongoing since last week Thursday right ankle initial and most affected subsequently 1st MTP joint on both feet affected now. Here for intraarticular steroid injection.  Also recommended prednisone taper starting tomorrow after injection. F/U in 6 wks to recheck with uncontrolled uric acid last visit.

## 2023-06-21 NOTE — Telephone Encounter (Signed)
Patient seen at the office for gout flare for joint injection and new prednisone taper based on findings.

## 2023-08-04 NOTE — Progress Notes (Signed)
Office Visit Note  Patient: Richard Berger             Date of Birth: 10-27-1976           MRN: [id number]             PCP: Jackie Plum, MD Referring: Jackie Plum, MD Visit Date: 08/05/2023   Subjective:  Follow-up (Patient states his left ankle is swollen. )   History of Present Illness: Richard Berger is a 47 y.o. male here for follow up for chronic gouty arthritis on Uloric 80 mg daily and colchicine 0.6 mg as needed usually takes this most days of the week but alternate sometimes with the 800 mg ibuprofen.  We last saw him on August 13 due to flareup with right ankle pain and effusion treated with tibiotalar joint steroid injection and symptoms improved.  He is now back for his planned follow-up for recheck of gout treatment previous uric acid level was 10.0 in May.  He was recommended to try increasing the febuxostat but felt taking the increased dose all at the same time giving him a drowsy or groggy sensation and was usually forgetting the half tablet if he split the dose.  Currently has a new flareup with pain and swelling intensely on the medial side of his left foot just below the ankle.   Previous HPI 04/01/23 Richard Berger is a 47 y.o. male here for follow up for chronic gout on Uloric 80 mg daily and colchicine 0.6 mg as needed.  It has been about a year since we followed up symptoms have mostly been well-controlled reports at least 2 flareups with pain and swelling involving the left foot.  Currently he has some inflammation this started about 2 weeks ago and has partially improved since onset.  Prior to this had another episode about 7 or 8 months prior that was treated with the colchicine.  He has not had persistent daily symptoms outside of these besides mild use related pain that normally does not have much associated swelling.   Previous HPI 12/18/21 Richard Berger is a 47 y.o. male here for follow up for chronic gout on uloric 80 mg daily and colchicine 0.6 mg  daily. He has left foot pain and swelling ongoing for about 3 days. This started at the great toe but involving the medial side of the foot all the way to the ankle now and somewhat on plantar side. He is taking the colchicine which usually works for his flares but not resolved yet. He is interested in a steroid shot to resolve the current inflammation. He does have some pain on the bottom of the heel and foot pretty frequently that he attributes to walking on concrete surfaces for 10 hour shifts.   Previous HPI 09/18/21 Richard Berger is a 47 y.o. male here for follow up for chronic gout after increasing uloric to 80 mg daily and colchicine 0.6 mg daily. Symptoms are well controlled without any major flares or attacks. He has developed some swelling around the left ankle not painful. No side effects noticeable with the medication increase.    Previous HPI 05/20/21 Richard Berger is a 47 y.o. male here for recurrent gout and positive ANA. His primary care office has seen him for podagra treated with prednisone and colchicine tablets.  He was also started apparently on Uloric 40 mg p.o. daily for hyperuricemia but states he is not taking the medicine since a few days ago.  He has had continued joint pain in his hands and knees and feet the most recent active swelling and severe pain was in the right great toe.  He has had infrequent swelling of the left great toe and has had involvement of bilateral knees he denies any obvious episodes of swelling inflammation involving the upper extremities.  From his history it is not very clear his exact previous treatments he is taken multiple medications including diclofenac, prednisone, colchicine, and topical Voltaren for joint inflammation but is currently off the colchicine.  He reports typically noticing improvement while taking this but rapid return of symptoms after discontinuation.   Review of Systems  Constitutional:  Positive for fatigue.  HENT:  Negative for  mouth sores and mouth dryness.   Eyes:  Negative for dryness.  Respiratory:  Negative for shortness of breath.   Cardiovascular:  Negative for chest pain and palpitations.  Gastrointestinal:  Negative for blood in stool, constipation and diarrhea.  Endocrine: Negative for increased urination.  Genitourinary:  Negative for involuntary urination.  Musculoskeletal:  Positive for joint pain, joint pain, joint swelling, morning stiffness and muscle tenderness. Negative for gait problem, myalgias, muscle weakness and myalgias.  Skin:  Negative for color change, rash, hair loss and sensitivity to sunlight.  Allergic/Immunologic: Negative for susceptible to infections.  Neurological:  Negative for dizziness and headaches.  Hematological:  Negative for swollen glands.  Psychiatric/Behavioral:  Negative for depressed mood and sleep disturbance. The patient is not nervous/anxious.     PMFS History:  Patient Active Problem List   Diagnosis Date Noted   Plantar fasciitis 12/18/2021   Chronic gouty arthritis 12/18/2021   Chronic idiopathic constipation 12/18/2021   Gastroesophageal reflux disease 12/18/2021   Internal hemorrhoids 12/18/2021   Lumbar spondylosis 12/18/2021   Mild persistent asthma, uncomplicated 12/18/2021   Other seasonal allergic rhinitis 12/18/2021   Tobacco abuse 12/18/2021   Vitamin D deficiency 12/18/2021   Bilateral hand pain 05/20/2021   Acute bronchitis 12/01/2016   Pleurisy 12/01/2016   OSA (obstructive sleep apnea) 05/06/2016   Erectile dysfunction 05/06/2016   Morbid obesity (HCC) 05/02/2015   Obesity 01/17/2015   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 No pertinent family history. Past Surgical History:  Procedure Laterality Date   EYE SURGERY Right    Cataract   Social History   Social History Narrative   Marital status: single; dating. Moved from [place] to [place] in 2001 with his father.  Family (1 brother and 1 sister) in [place]. Mother died there 01/31/13.      Children: one son in [place].      Employment: employed      Tobacco: 1-2 cigarettes a week.      Alcohol: alcohol on weekends.        Immunization History  Administered Date(s) Administered   Influenza, Seasonal, Injecte, Preservative Fre 08/31/2018, 08/02/2019   Moderna Sars-Covid-2 Vaccination 02/13/2020, 03/12/2020, 08/14/2021   Tdap 04/28/2014     Objective: Vital Signs: BP 119/84 (BP Location: Left Arm, Patient Position: Sitting, Cuff Size: Normal)   Pulse 99   Resp 14   Ht 5\' 5"  (1.651 m)   Wt 255 lb (115.7 kg)   BMI 42.43 kg/m    Physical Exam Constitutional:      Appearance: He is obese.  Eyes:     Conjunctiva/sclera: Conjunctivae normal.  Cardiovascular:     Rate and Rhythm: Normal rate and regular rhythm.  Pulmonary:     Effort: Pulmonary effort is normal.     Breath sounds: Normal breath sounds.  Musculoskeletal:     Comments: Trace pedal edema in left leg above ankle and on top of foot  Lymphadenopathy:     Cervical: No cervical adenopathy.  Skin:    General: Skin is warm and dry.     Findings: No rash.  Neurological:     Mental Status: He is alert.  Psychiatric:        Mood and Affect: Mood normal.      Musculoskeletal Exam:  Wrists full ROM no tenderness or swelling Fingers full ROM no tenderness or swelling Knees full ROM no tenderness or swelling, patellofemoral crepitus present Right ankle full range of motion with no tenderness, left ankle large soft tissue swelling on the medial side just distal to medial malleolus, tender to pressure and pain with inversion and eversion range of motion MTPs full ROM no tenderness or swelling   Investigation: No additional findings.  Imaging: No results found.  Recent Labs: Lab Results  Component Value Date   WBC 11.7 (H) 05/20/2021   HGB 15.7 05/20/2021   PLT 364  05/20/2021   NA 140 04/01/2023   K 4.1 04/01/2023   CL 105 04/01/2023   CO2 26 04/01/2023   GLUCOSE 69 04/01/2023   BUN 17 04/01/2023   CREATININE 0.92 04/01/2023   BILITOT 0.4 04/01/2023   ALKPHOS 76 08/19/2018   AST 25 04/01/2023   ALT 34 04/01/2023   PROT 7.1 04/01/2023   ALBUMIN 4.2 08/19/2018   CALCIUM 9.8 04/01/2023   GFRAA 108 08/19/2018    Speciality Comments: No specialty comments available.  Procedures:  Medium Joint Inj: L ankle on 08/05/2023 11:45 AM Indications: pain and joint swelling Details: 27 G 1.5 in needle, medial approach Medications: 1 mL lidocaine 1 %; 40 mg triamcinolone acetonide 40 MG/ML Outcome: tolerated well, no immediate complications Procedure, treatment alternatives, risks and benefits explained, specific risks discussed. Consent was given by the patient. Immediately prior to procedure a time out was called to verify the correct patient, procedure, equipment, support staff and site/side marked as required. Patient was prepped and draped in the usual sterile fashion.     Allergies: Patient has no known allergies.   Assessment / Plan:     Visit Diagnoses: Idiopathic chronic gout of foot without tophus, unspecified laterality -  Chronic gouty arthritis - Plan: Uric acid, BASIC METABOLIC PANEL WITH GFR, Sedimentation rate  Recurrent flareup now on left ankle still looks consistent with his gouty arthritis.  Reports intolerance to increase Uloric dose.  Continue Uloric 80 mg daily.  Agree with continued colchicine and NSAID prophylaxis daily.  Renal function has been normal on most recent lab results.  Discussed addition of probenecid if uric acid remains above goal which I do anticipate would recommend 500 mg twice daily.  Repeat intra-articular steroid injection today at left ankle subtalar joint.  Orders: Orders Placed This Encounter  Procedures   Medium Joint Inj   Uric acid   BASIC METABOLIC PANEL WITH GFR   Sedimentation rate   No orders  of the defined types were placed in this encounter.    Follow-Up Instructions: Return in about 3 months (around 11/04/2023) for Gout uloric/probenecid start f/u 3mos.   Fuller Plan, MD  Note - This record has been created using [employer].  Chart creation errors  have been sought, but may not always  have been located. Such creation errors do not reflect on  the standard of medical care.

## 2023-08-05 ENCOUNTER — Ambulatory Visit: Payer: PRIVATE HEALTH INSURANCE | Attending: Internal Medicine | Admitting: Internal Medicine

## 2023-08-05 ENCOUNTER — Encounter: Payer: Self-pay | Admitting: Internal Medicine

## 2023-08-05 VITALS — BP 119/84 | HR 99 | Resp 14 | Ht 65.0 in | Wt 255.0 lb

## 2023-08-05 DIAGNOSIS — M1A00X0 Idiopathic chronic gout, unspecified site, without tophus (tophi): Secondary | ICD-10-CM | POA: Diagnosis not present

## 2023-08-05 DIAGNOSIS — M1A0790 Idiopathic chronic gout, unspecified ankle and foot, without tophus (tophi): Secondary | ICD-10-CM | POA: Diagnosis not present

## 2023-08-05 DIAGNOSIS — M1A0720 Idiopathic chronic gout, left ankle and foot, without tophus (tophi): Secondary | ICD-10-CM

## 2023-08-05 DIAGNOSIS — M10071 Idiopathic gout, right ankle and foot: Secondary | ICD-10-CM

## 2023-08-05 MED ORDER — LIDOCAINE HCL 1 % IJ SOLN
1.0000 mL | INTRAMUSCULAR | Status: AC | PRN
Start: 2023-08-05 — End: 2023-08-05
  Administered 2023-08-05: 1 mL

## 2023-08-05 MED ORDER — TRIAMCINOLONE ACETONIDE 40 MG/ML IJ SUSP
40.0000 mg | INTRAMUSCULAR | Status: AC | PRN
Start: 2023-08-05 — End: 2023-08-05
  Administered 2023-08-05: 40 mg via INTRA_ARTICULAR

## 2023-08-06 LAB — BASIC METABOLIC PANEL WITH GFR
BUN: 7 mg/dL (ref 7–25)
CO2: 28 mmol/L (ref 20–32)
Calcium: 9.9 mg/dL (ref 8.6–10.3)
Chloride: 104 mmol/L (ref 98–110)
Creat: 1.03 mg/dL (ref 0.60–1.29)
Glucose, Bld: 98 mg/dL (ref 65–99)
Potassium: 4.2 mmol/L (ref 3.5–5.3)
Sodium: 139 mmol/L (ref 135–146)
eGFR: 91 mL/min/{1.73_m2} (ref >=60)

## 2023-08-06 LAB — SEDIMENTATION RATE: Sed Rate: 6 mm/h (ref 0–15)

## 2023-08-06 LAB — URIC ACID: Uric Acid, Serum: 8.2 mg/dL — ABNORMAL HIGH (ref 4.0–8.0)

## 2023-08-09 ENCOUNTER — Telehealth: Payer: Self-pay | Admitting: Internal Medicine

## 2023-08-09 NOTE — Telephone Encounter (Signed)
Patient called stating he was returning a call regarding his labwork results.  Patient states he is on his way to work so if he doesn't answer he will call back tomorrow, 08/10/23.

## 2023-08-11 MED ORDER — PROBENECID 500 MG PO TABS
500.0000 mg | ORAL_TABLET | Freq: Two times a day (BID) | ORAL | 1 refills | Status: DC
Start: 2023-08-11 — End: 2024-04-27

## 2023-08-11 MED ORDER — FEBUXOSTAT 80 MG PO TABS
80.0000 mg | ORAL_TABLET | Freq: Every day | ORAL | 0 refills | Status: DC
Start: 2023-08-11 — End: 2024-04-27

## 2023-08-11 NOTE — Progress Notes (Signed)
Uric acid remains high at 8.2.  Based on this I recommend he continue the Uloric 80 mg 1 pill once every day. we can add the probenecid to this like we discussed, I am sending the prescription today.  This will be 500 mg 1 pill twice daily.

## 2023-08-11 NOTE — Telephone Encounter (Signed)
Addressed in result note toda

## 2023-08-11 NOTE — Addendum Note (Signed)
Addended by: Fuller Plan on: 08/11/2023 09:02 AM   Modules accepted: Orders

## 2023-08-16 ENCOUNTER — Encounter: Payer: Self-pay | Admitting: *Deleted

## 2023-09-07 NOTE — Progress Notes (Deleted)
Office Visit Note  Patient: Richard Berger             Date of Birth: January 05, 1976           MRN: [id number]             PCP: Jackie Plum, MD Referring: Jackie Plum, MD Visit Date: 09/21/2023   Subjective:  No chief complaint on file.   History of Present Illness: Richard Berger is a 47 y.o. male here for follow up for chronic gouty arthritis on Uloric 80 mg daily and colchicine 0.6 mg as needed usually takes this most days of the week but alternate sometimes with the 800 mg ibuprofen.    Previous HPI 08/05/2023 Richard Berger is a 47 y.o. male here for follow up for chronic gouty arthritis on Uloric 80 mg daily and colchicine 0.6 mg as needed usually takes this most days of the week but alternate sometimes with the 800 mg ibuprofen.  We last saw him on August 13 due to flareup with right ankle pain and effusion treated with tibiotalar joint steroid injection and symptoms improved.  He is now back for his planned follow-up for recheck of gout treatment previous uric acid level was 10.0 in May.  He was recommended to try increasing the febuxostat but felt taking the increased dose all at the same time giving him a drowsy or groggy sensation and was usually forgetting the half tablet if he split the dose.  Currently has a new flareup with pain and swelling intensely on the medial side of his left foot just below the ankle.     Previous HPI 04/01/23 Richard Berger is a 47 y.o. male here for follow up for chronic gout on Uloric 80 mg daily and colchicine 0.6 mg as needed.  It has been about a year since we followed up symptoms have mostly been well-controlled reports at least 2 flareups with pain and swelling involving the left foot.  Currently he has some inflammation this started about 2 weeks ago and has partially improved since onset.  Prior to this had another episode about 7 or 8 months prior that was treated with the colchicine.  He has not had persistent daily symptoms outside of  these besides mild use related pain that normally does not have much associated swelling.   Previous HPI 12/18/21 Richard Berger is a 47 y.o. male here for follow up for chronic gout on uloric 80 mg daily and colchicine 0.6 mg daily. He has left foot pain and swelling ongoing for about 3 days. This started at the great toe but involving the medial side of the foot all the way to the ankle now and somewhat on plantar side. He is taking the colchicine which usually works for his flares but not resolved yet. He is interested in a steroid shot to resolve the current inflammation. He does have some pain on the bottom of the heel and foot pretty frequently that he attributes to walking on concrete surfaces for 10 hour shifts.   Previous HPI 09/18/21 Richard Berger is a 47 y.o. male here for follow up for chronic gout after increasing uloric to 80 mg daily and colchicine 0.6 mg daily. Symptoms are well controlled without any major flares or attacks. He has developed some swelling around the left ankle not painful. No side effects noticeable with the medication increase.    Previous HPI 05/20/21 Richard Berger is a 47 y.o. male here for  recurrent gout and positive ANA. His primary care office has seen him for podagra treated with prednisone and colchicine tablets.  He was also started apparently on Uloric 40 mg p.o. daily for hyperuricemia but states he is not taking the medicine since a few days ago.  He has had continued joint pain in his hands and knees and feet the most recent active swelling and severe pain was in the right great toe.  He has had infrequent swelling of the left great toe and has had involvement of bilateral knees he denies any obvious episodes of swelling inflammation involving the upper extremities.  From his history it is not very clear his exact previous treatments he is taken multiple medications including diclofenac, prednisone, colchicine, and topical Voltaren for joint inflammation but is  currently off the colchicine.  He reports typically noticing improvement while taking this but rapid return of symptoms after discontinuation.   No Rheumatology ROS completed.   PMFS History:  Patient Active Problem List   Diagnosis Date Noted   Plantar fasciitis 12/18/2021   Chronic gouty arthritis 12/18/2021   Chronic idiopathic constipation 12/18/2021   Gastroesophageal reflux disease 12/18/2021   Internal hemorrhoids 12/18/2021   Lumbar spondylosis 12/18/2021   Mild persistent asthma, uncomplicated 12/18/2021   Other seasonal allergic rhinitis 12/18/2021   Tobacco abuse 12/18/2021   Vitamin D deficiency 12/18/2021   Bilateral hand pain 05/20/2021   Acute bronchitis 12/01/2016   Pleurisy 12/01/2016   OSA (obstructive sleep apnea) 05/06/2016   Erectile dysfunction 05/06/2016   Morbid obesity (HCC) 05/02/2015   Obesity 01/17/2015   Post corneal transplant 04/28/2014   Gout 06/09/2012    Past Medical History:  Diagnosis Date   Cataract    Gout     No family history on file. Past Surgical History:  Procedure Laterality Date   EYE SURGERY Right    Cataract   Social History   Social History Narrative   Marital status: single; dating. Moved from [place] to [place] in 2001 with his father.  Family (1 brother and 1 sister) in [place]. Mother died there 2013/01/27.      Children: one son in [place].      Employment: employed      Tobacco: 1-2 cigarettes a week.      Alcohol: alcohol on weekends.        Immunization History  Administered Date(s) Administered   Influenza, Seasonal, Injecte, Preservative Fre 08/31/2018, 08/02/2019   Moderna Sars-Covid-2 Vaccination 02/13/2020, 03/12/2020, 08/14/2021   Tdap 04/28/2014     Objective: Vital Signs: There were no vitals taken for this visit.   Physical Exam   Musculoskeletal Exam: ***  CDAI Exam: CDAI Score: -- Patient Global: --; Provider Global: -- Swollen: --; Tender: -- Joint Exam 09/21/2023    No joint exam has been documented for this visit   There is currently no information documented on the homunculus. Go to the Rheumatology activity and complete the homunculus joint exam.  Investigation: No additional findings.  Imaging: No results found.  Recent Labs: Lab Results  Component Value Date   WBC 11.7 (H) 05/20/2021   HGB 15.7 05/20/2021   PLT 364 05/20/2021   NA 139 08/05/2023   K 4.2 08/05/2023   CL 104 08/05/2023   CO2 28 08/05/2023   GLUCOSE 98 08/05/2023   BUN 7 08/05/2023   CREATININE 1.03 08/05/2023   BILITOT 0.4 04/01/2023   ALKPHOS 76 08/19/2018   AST 25 04/01/2023   ALT 34  04/01/2023   PROT 7.1 04/01/2023   ALBUMIN 4.2 08/19/2018   CALCIUM 9.9 08/05/2023   GFRAA 108 08/19/2018    Speciality Comments: No specialty comments available.  Procedures:  No procedures performed Allergies: Patient has no known allergies.   Assessment / Plan:     Visit Diagnoses: No diagnosis found.  ***  Orders: No orders of the defined types were placed in this encounter.  No orders of the defined types were placed in this encounter.    Follow-Up Instructions: No follow-ups on file.   Metta Clines, RT  Note - This record has been created using [employer].  Chart creation errors have been sought, but may not always  have been located. Such creation errors do not reflect on  the standard of medical care.

## 2023-09-21 ENCOUNTER — Ambulatory Visit: Payer: PRIVATE HEALTH INSURANCE | Admitting: Internal Medicine

## 2023-09-21 DIAGNOSIS — M1A00X0 Idiopathic chronic gout, unspecified site, without tophus (tophi): Secondary | ICD-10-CM

## 2023-09-21 DIAGNOSIS — M1A0790 Idiopathic chronic gout, unspecified ankle and foot, without tophus (tophi): Secondary | ICD-10-CM

## 2023-09-21 DIAGNOSIS — Z5181 Encounter for therapeutic drug level monitoring: Secondary | ICD-10-CM

## 2024-01-19 NOTE — Progress Notes (Deleted)
 Office Visit Note  Patient: Richard Berger             Date of Birth: [birth year]           MRN: [id number]             PCP: Jackie Plum, MD Referring: Jackie Plum, MD Visit Date: 02/02/2024   Subjective:  No chief complaint on file.   History of Present Illness: ARNET HOFFERBER is a 48 y.o. male here for follow up for chronic gouty arthritis on Uloric 80 mg daily and colchicine 0.6 mg as needed usually takes this most days of the week but alternate sometimes with the 800 mg ibuprofen.    Previous HPI 08/05/2023 GEORDAN XU is a 48 y.o. male here for follow up for chronic gouty arthritis on Uloric 80 mg daily and colchicine 0.6 mg as needed usually takes this most days of the week but alternate sometimes with the 800 mg ibuprofen.  We last saw him on August 13 due to flareup with right ankle pain and effusion treated with tibiotalar joint steroid injection and symptoms improved.  He is now back for his planned follow-up for recheck of gout treatment previous uric acid level was 10.0 in May.  He was recommended to try increasing the febuxostat but felt taking the increased dose all at the same time giving him a drowsy or groggy sensation and was usually forgetting the half tablet if he split the dose.  Currently has a new flareup with pain and swelling intensely on the medial side of his left foot just below the ankle.     Previous HPI 04/01/23 HERALD VALLIN is a 48 y.o. male here for follow up for chronic gout on Uloric 80 mg daily and colchicine 0.6 mg as needed.  It has been about a year since we followed up symptoms have mostly been well-controlled reports at least 2 flareups with pain and swelling involving the left foot.  Currently he has some inflammation this started about 2 weeks ago and has partially improved since onset.  Prior to this had another episode about 7 or 8 months prior that was treated with the colchicine.  He has not had persistent daily symptoms outside of  these besides mild use related pain that normally does not have much associated swelling.   Previous HPI 12/18/21 ERDEM NAAS is a 48 y.o. male here for follow up for chronic gout on uloric 80 mg daily and colchicine 0.6 mg daily. He has left foot pain and swelling ongoing for about 3 days. This started at the great toe but involving the medial side of the foot all the way to the ankle now and somewhat on plantar side. He is taking the colchicine which usually works for his flares but not resolved yet. He is interested in a steroid shot to resolve the current inflammation. He does have some pain on the bottom of the heel and foot pretty frequently that he attributes to walking on concrete surfaces for 10 hour shifts.   Previous HPI 09/18/21 AMADEUS OYAMA is a 48 y.o. male here for follow up for chronic gout after increasing uloric to 80 mg daily and colchicine 0.6 mg daily. Symptoms are well controlled without any major flares or attacks. He has developed some swelling around the left ankle not painful. No side effects noticeable with the medication increase.    Previous HPI 05/20/21 DRYSTAN READER is a 48 y.o. male here for  recurrent gout and positive ANA. His primary care office has seen him for podagra treated with prednisone and colchicine tablets.  He was also started apparently on Uloric 40 mg p.o. daily for hyperuricemia but states he is not taking the medicine since a few days ago.  He has had continued joint pain in his hands and knees and feet the most recent active swelling and severe pain was in the right great toe.  He has had infrequent swelling of the left great toe and has had involvement of bilateral knees he denies any obvious episodes of swelling inflammation involving the upper extremities.  From his history it is not very clear his exact previous treatments he is taken multiple medications including diclofenac, prednisone, colchicine, and topical Voltaren for joint inflammation but is  currently off the colchicine.  He reports typically noticing improvement while taking this but rapid return of symptoms after discontinuation.   No Rheumatology ROS completed.   PMFS History:  Patient Active Problem List   Diagnosis Date Noted   Plantar fasciitis 12/18/2021   Chronic gouty arthritis 12/18/2021   Chronic idiopathic constipation 12/18/2021   Gastroesophageal reflux disease 12/18/2021   Internal hemorrhoids 12/18/2021   Lumbar spondylosis 12/18/2021   Mild persistent asthma, uncomplicated 12/18/2021   Other seasonal allergic rhinitis 12/18/2021   Tobacco abuse 12/18/2021   Vitamin D deficiency 12/18/2021   Bilateral hand pain 05/20/2021   Acute bronchitis 12/01/2016   Pleurisy 12/01/2016   OSA (obstructive sleep apnea) 05/06/2016   Erectile dysfunction 05/06/2016   Morbid obesity (HCC) 05/02/2015   Obesity 01/17/2015   Post corneal transplant 04/28/2014   Gout 06/09/2012    Past Medical History:  Diagnosis Date   Cataract    Gout     No family history on file. Past Surgical History:  Procedure Laterality Date   EYE SURGERY Right    Cataract   Social History   Social History Narrative   Marital status: single; dating. Moved from [place] to [place] in 2001 with his father.  Family (1 brother and 1 sister) in [place]. Mother died there 2013/01/29.      Children: one son in [place].      Employment: employed      Tobacco: 1-2 cigarettes a week.      Alcohol: alcohol on weekends.        Immunization History  Administered Date(s) Administered   Influenza, Seasonal, Injecte, Preservative Fre 08/31/2018, 08/02/2019   Moderna Sars-Covid-2 Vaccination 02/13/2020, 03/12/2020, 08/14/2021   Tdap 04/28/2014     Objective: Vital Signs: There were no vitals taken for this visit.   Physical Exam   Musculoskeletal Exam: ***  CDAI Exam: CDAI Score: -- Patient Global: --; Provider Global: -- Swollen: --; Tender: -- Joint Exam 02/02/2024    No joint exam has been documented for this visit   There is currently no information documented on the homunculus. Go to the Rheumatology activity and complete the homunculus joint exam.  Investigation: No additional findings.  Imaging: No results found.  Recent Labs: Lab Results  Component Value Date   WBC 11.7 (H) 05/20/2021   HGB 15.7 05/20/2021   PLT 364 05/20/2021   NA 139 08/05/2023   K 4.2 08/05/2023   CL 104 08/05/2023   CO2 28 08/05/2023   GLUCOSE 98 08/05/2023   BUN 7 08/05/2023   CREATININE 1.03 08/05/2023   BILITOT 0.4 04/01/2023   ALKPHOS 76 08/19/2018   AST 25 04/01/2023   ALT 34  04/01/2023   PROT 7.1 04/01/2023   ALBUMIN 4.2 08/19/2018   CALCIUM 9.9 08/05/2023   GFRAA 108 08/19/2018    Speciality Comments: No specialty comments available.  Procedures:  No procedures performed Allergies: Patient has no known allergies.   Assessment / Plan:     Visit Diagnoses: No diagnosis found.  ***  Orders: No orders of the defined types were placed in this encounter.  No orders of the defined types were placed in this encounter.    Follow-Up Instructions: No follow-ups on file.   Metta Clines, RT  Note - This record has been created using [employer].  Chart creation errors have been sought, but may not always  have been located. Such creation errors do not reflect on  the standard of medical care.

## 2024-02-02 ENCOUNTER — Ambulatory Visit: Payer: Self-pay | Admitting: Internal Medicine

## 2024-02-02 DIAGNOSIS — Z5181 Encounter for therapeutic drug level monitoring: Secondary | ICD-10-CM

## 2024-02-02 DIAGNOSIS — M1A0790 Idiopathic chronic gout, unspecified ankle and foot, without tophus (tophi): Secondary | ICD-10-CM

## 2024-02-02 DIAGNOSIS — M1A00X0 Idiopathic chronic gout, unspecified site, without tophus (tophi): Secondary | ICD-10-CM

## 2024-03-07 ENCOUNTER — Other Ambulatory Visit: Payer: Self-pay | Admitting: Internal Medicine

## 2024-03-07 DIAGNOSIS — M10071 Idiopathic gout, right ankle and foot: Secondary | ICD-10-CM

## 2024-04-13 NOTE — Progress Notes (Signed)
 Office Visit Note  Patient: Richard Berger             Date of Birth: 1975/12/12           MRN: [id number]             PCP: Catalina Bare, MD Referring: Catalina Bare, MD Visit Date: 04/27/2024   Subjective:  Follow-up (Needs refill on prednisone . Pain in both knees been out of medication )   Discussed the use of AI scribe software for clinical note transcription with the patient, who gave verbal consent to proceed.  History of Present Illness   Richard Berger is a 48 y.o. male here for follow up for chronic gouty arthritis on Uloric  80 mg daily and colchicine  0.6 mg as needed usually takes this most days of the week but alternate sometimes with the 800 mg ibuprofen.  Currently he is out of his medications and symptoms are in exacerbation.  He has been experiencing swelling in his knees for approximately one month, which makes it difficult to put on socks. The pain is mild, intermittent, and occurs particularly in the mornings.  He has a history of gout and has been on colchicine  for management. He feels that the medication is no longer effective and has been using over-the-counter ibuprofen for pain relief.   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 This physical activity may exacerbate his symptoms.   Previous HPI 08/05/2023 Richard Berger is a 48 y.o. male here for follow up for chronic gouty arthritis on Uloric  80 mg daily and colchicine  0.6 mg as needed usually takes this most days of the week but alternate sometimes with the 800 mg ibuprofen.  We last saw him on August 13 due to flareup with right ankle pain and effusion treated with tibiotalar joint steroid injection and symptoms improved.  He is now back for his planned follow-up for recheck of gout treatment previous uric acid level was 10.0 in May.  He was recommended to try increasing the febuxostat  but felt taking the increased dose all at the same time giving him a drowsy or  groggy sensation and was usually forgetting the half tablet if he split the dose.  Currently has a new flareup with pain and swelling intensely on the medial side of his left foot just below the ankle.     Previous HPI 04/01/23 Richard Berger is a 48 y.o. male here for follow up for chronic gout on Uloric  80 mg daily and colchicine  0.6 mg as needed.  It has been about a year since we followed up symptoms have mostly been well-controlled reports at least 2 flareups with pain and swelling involving the left foot.  Currently he has some inflammation this started about 2 weeks ago and has partially improved since onset.  Prior to this had another episode about 7 or 8 months prior that was treated with the colchicine .  He has not had persistent daily symptoms outside of these besides mild use related pain that normally does not have much associated swelling.   Previous HPI 12/18/21 Richard Berger is a 48 y.o. male here for follow up for chronic gout on uloric  80 mg daily and colchicine  0.6 mg daily. He has left foot pain and swelling ongoing for about 3 days. This started at the great toe but involving the medial side of the foot all the way to the ankle now and somewhat on plantar side. He  is taking the colchicine  which usually works for his flares but not resolved yet. He is interested in a steroid shot to resolve the current inflammation. He does have some pain on the bottom of the heel and foot pretty frequently that he attributes to walking on concrete surfaces for 10 hour shifts.   Previous HPI 09/18/21 Richard Berger is a 48 y.o. male here for follow up for chronic gout after increasing uloric  to 80 mg daily and colchicine  0.6 mg daily. Symptoms are well controlled without any major flares or attacks. He has developed some swelling around the left ankle not painful. No side effects noticeable with the medication increase.    Previous HPI 05/20/21 Richard Berger is a 48 y.o. male here for recurrent gout  and positive ANA. His primary care office has seen him for podagra treated with prednisone  and colchicine  tablets.  He was also started apparently on Uloric  40 mg p.o. daily for hyperuricemia but states he is not taking the medicine since a few days ago.  He has had continued joint pain in his hands and knees and feet the most recent active swelling and severe pain was in the right great toe.  He has had infrequent swelling of the left great toe and has had involvement of bilateral knees he denies any obvious episodes of swelling inflammation involving the upper extremities.  From his history it is not very clear his exact previous treatments he is taken multiple medications including diclofenac , prednisone , colchicine , and topical Voltaren  for joint inflammation but is currently off the colchicine .  He reports typically noticing improvement while taking this but rapid return of symptoms after discontinuation.   Review of Systems  Constitutional:  Positive for fatigue.  HENT:  Negative for mouth sores and mouth dryness.   Eyes:  Negative for dryness.  Respiratory:  Negative for shortness of breath.   Cardiovascular:  Negative for chest pain and palpitations.  Gastrointestinal:  Positive for constipation. Negative for blood in stool and diarrhea.  Endocrine: Negative for increased urination.  Genitourinary:  Negative for involuntary urination.  Musculoskeletal:  Positive for joint pain, gait problem, joint pain, joint swelling, morning stiffness and muscle tenderness. Negative for myalgias, muscle weakness and myalgias.  Skin:  Negative for color change, rash, hair loss and sensitivity to sunlight.  Allergic/Immunologic: Negative for susceptible to infections.  Neurological:  Negative for dizziness and headaches.  Hematological:  Negative for swollen glands.  Psychiatric/Behavioral:  Negative for depressed mood and sleep disturbance. The patient is not nervous/anxious.     PMFS History:  Patient  Active Problem List   Diagnosis Date Noted   Plantar fasciitis 12/18/2021   Chronic gouty arthritis 12/18/2021   Chronic idiopathic constipation 12/18/2021   Gastroesophageal reflux disease 12/18/2021   Internal hemorrhoids 12/18/2021   Lumbar spondylosis 12/18/2021   Mild persistent asthma, uncomplicated 12/18/2021   Other seasonal allergic rhinitis 12/18/2021   Tobacco abuse 12/18/2021   Vitamin D  deficiency 12/18/2021   Bilateral hand pain 05/20/2021   Acute bronchitis 12/01/2016   Pleurisy 12/01/2016   OSA (obstructive sleep apnea) 05/06/2016   Erectile dysfunction 05/06/2016   Morbid obesity (HCC) 05/02/2015   Obesity 01/17/2015   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 No pertinent family history. Past Surgical History:  Procedure Laterality Date   EYE SURGERY Right    Cataract  Social History   Social History Narrative   Marital status: single; dating. Moved from [place] to [place]  in [place] with his father.  Family (1 brother and 1 sister) in [place]. Mother died there Jan 24, 2013.      Children: one son in [place].      Employment: employed      Tobacco: 1-2 cigarettes a week.      Alcohol: alcohol on weekends.        Immunization History  Administered Date(s) Administered   Influenza, Seasonal, Injecte, Preservative Fre 08/31/2018, 08/02/2019   Moderna Sars-Covid-2 Vaccination 02/13/2020, 03/12/2020, 08/14/2021   Tdap 04/28/2014     Objective: Vital Signs: BP 120/78 (BP Location: Left Arm, Patient Position: Sitting, Cuff Size: Normal)   Pulse 92   Resp 16   Ht 5' 5 (1.651 m)   Wt 264 lb 12.8 oz (120.1 kg)   BMI 44.07 kg/m    Physical Exam Constitutional:      Appearance: He is obese.   Eyes:     Conjunctiva/sclera: Conjunctivae normal.    Cardiovascular:     Rate and Rhythm: Normal rate and regular rhythm.  Pulmonary:     Effort:  Pulmonary effort is normal.     Breath sounds: Normal breath sounds.   Skin:    General: Skin is warm and dry.     Comments: 1+ pitting edema b/l   Neurological:     Mental Status: He is alert.   Psychiatric:        Mood and Affect: Mood normal.      Musculoskeletal Exam:  Elbows full ROM no tenderness or swelling Wrists full ROM no tenderness or swelling Fingers full ROM no tenderness or swelling No paraspinal tenderness to palpation over upper and lower back Hip normal internal and external rotation without pain, no tenderness to lateral hip palpation Knees full ROM, bilateral joint line tenderness, trace effusions LEft ankle with prominent medial swelling, pain with moveement 1st MTP joint pain and swelling   Investigation: No additional findings.  Imaging: No results found.  Recent Labs: Lab Results  Component Value Date   WBC 11.7 (H) 05/20/2021   HGB 15.7 05/20/2021   PLT 364 05/20/2021   NA 139 08/05/2023   K 4.2 08/05/2023   CL 104 08/05/2023   CO2 28 08/05/2023   GLUCOSE 98 08/05/2023   BUN 7 08/05/2023   CREATININE 1.03 08/05/2023   BILITOT 0.4 04/01/2023   ALKPHOS 76 08/19/2018   AST 25 04/01/2023   ALT 34 04/01/2023   PROT 7.1 04/01/2023   ALBUMIN 4.2 08/19/2018   CALCIUM 9.9 08/05/2023   GFRAA 108 08/19/2018    Speciality Comments: No specialty comments available.  Procedures:  No procedures performed Allergies: Patient has no known allergies.   Assessment / Plan:     Visit Diagnoses: Idiopathic chronic gout of foot without tophus, unspecified laterality - 08/05/2023 Uric Acid 8.2 - Plan: predniSONE  (DELTASONE ) 20 MG tablet Chronic gout exacerbated by non-adherence to colchicine  and Uloric , resulting in leg swelling and stiffness. No joint aspiration needed. - Prescribed Uloric  80 mg resume daily maintenance dose - Prescribed colchicine  for flare then daily as needed - Prescribed prednisone  taper from 40 mg daily down short-term flare-up  management.  Orders: No orders of the defined types were placed in this encounter.  Meds ordered this encounter  Medications   colchicine  (COLCRYS ) 0.6 MG tablet    Sig: Take 1 tablet (0.6 mg total) by mouth daily as  needed.    Dispense:  30 tablet    Refill:  3   Febuxostat  80 MG TABS    Sig: Take 1 tablet (80 mg total) by mouth daily.    Dispense:  90 tablet    Refill:  1   predniSONE  (DELTASONE ) 20 MG tablet    Sig: Take 2 tablets (40 mg total) by mouth daily with breakfast for 3 days, THEN 1 tablet (20 mg total) daily with breakfast for 3 days.    Dispense:  9 tablet    Refill:  0     Follow-Up Instructions: Return in about 6 months (around 10/27/2024) for Gout on uloric /colchicine  f/u 6mos.   Lonni LELON Ester, MD  Note - This record has been created using [employer].  Chart creation errors have been sought, but may not always  have been located. Such creation errors do not reflect on  the standard of medical care.

## 2024-04-27 ENCOUNTER — Encounter: Payer: Self-pay | Admitting: Internal Medicine

## 2024-04-27 ENCOUNTER — Ambulatory Visit: Payer: PRIVATE HEALTH INSURANCE | Attending: Internal Medicine | Admitting: Internal Medicine

## 2024-04-27 ENCOUNTER — Other Ambulatory Visit: Payer: Self-pay

## 2024-04-27 VITALS — BP 120/78 | HR 92 | Resp 16 | Ht 65.0 in | Wt 264.8 lb

## 2024-04-27 DIAGNOSIS — Z5181 Encounter for therapeutic drug level monitoring: Secondary | ICD-10-CM | POA: Diagnosis not present

## 2024-04-27 DIAGNOSIS — M1A0790 Idiopathic chronic gout, unspecified ankle and foot, without tophus (tophi): Secondary | ICD-10-CM

## 2024-04-27 DIAGNOSIS — M10071 Idiopathic gout, right ankle and foot: Secondary | ICD-10-CM

## 2024-04-27 DIAGNOSIS — M1A9XX0 Chronic gout, unspecified, without tophus (tophi): Secondary | ICD-10-CM | POA: Diagnosis not present

## 2024-04-27 MED ORDER — COLCHICINE 0.6 MG PO TABS: 0.6000 mg | ORAL_TABLET | Freq: Every day | ORAL | 3 refills | Status: DC | PRN

## 2024-04-27 MED ORDER — PREDNISONE 20 MG PO TABS
ORAL_TABLET | ORAL | 0 refills | Status: AC
Start: 2024-04-27 — End: 2024-05-03

## 2024-04-27 MED ORDER — FEBUXOSTAT 80 MG PO TABS
80.0000 mg | ORAL_TABLET | Freq: Every day | ORAL | 1 refills | Status: DC
Start: 2024-04-27 — End: 2024-10-02

## 2024-10-02 ENCOUNTER — Encounter: Payer: Self-pay | Admitting: Internal Medicine

## 2024-10-02 ENCOUNTER — Ambulatory Visit: Attending: Internal Medicine | Admitting: Internal Medicine

## 2024-10-02 VITALS — BP 132/85 | HR 99 | Temp 98.2°F | Resp 16 | Ht 65.0 in | Wt 281.8 lb

## 2024-10-02 DIAGNOSIS — M79641 Pain in right hand: Secondary | ICD-10-CM

## 2024-10-02 DIAGNOSIS — M1A0790 Idiopathic chronic gout, unspecified ankle and foot, without tophus (tophi): Secondary | ICD-10-CM

## 2024-10-02 DIAGNOSIS — M10071 Idiopathic gout, right ankle and foot: Secondary | ICD-10-CM

## 2024-10-02 DIAGNOSIS — M79642 Pain in left hand: Secondary | ICD-10-CM

## 2024-10-02 DIAGNOSIS — M1A9XX0 Chronic gout, unspecified, without tophus (tophi): Secondary | ICD-10-CM

## 2024-10-02 LAB — COMPREHENSIVE METABOLIC PANEL WITH GFR
AG Ratio: 1.5 (calc) (ref 1.0–2.5)
ALT: 30 U/L (ref 9–46)
AST: 21 U/L (ref 10–40)
Albumin: 4.3 g/dL (ref 3.6–5.1)
Alkaline phosphatase (APISO): 57 U/L (ref 36–130)
BUN: 10 mg/dL (ref 7–25)
CO2: 29 mmol/L (ref 20–32)
Calcium: 9.7 mg/dL (ref 8.6–10.3)
Chloride: 101 mmol/L (ref 98–110)
Creat: 0.87 mg/dL (ref 0.60–1.29)
Globulin: 2.8 g/dL (ref 1.9–3.7)
Glucose, Bld: 89 mg/dL (ref 65–99)
Potassium: 4.4 mmol/L (ref 3.5–5.3)
Sodium: 138 mmol/L (ref 135–146)
Total Bilirubin: 0.3 mg/dL (ref 0.2–1.2)
Total Protein: 7.1 g/dL (ref 6.1–8.1)
eGFR: 108 mL/min/1.73m2 (ref >=60)

## 2024-10-02 LAB — SEDIMENTATION RATE: Sed Rate: 6 mm/h (ref 0–15)

## 2024-10-02 LAB — URIC ACID: Uric Acid, Serum: 7.5 mg/dL (ref 4.0–8.0)

## 2024-10-02 MED ORDER — PREDNISONE 20 MG PO TABS
ORAL_TABLET | ORAL | 0 refills | Status: AC
Start: 2024-10-02 — End: 2024-10-08

## 2024-10-02 MED ORDER — COLCHICINE 0.6 MG PO TABS: 0.6000 mg | ORAL_TABLET | Freq: Every day | ORAL | 1 refills | Status: AC | PRN

## 2024-10-02 MED ORDER — FEBUXOSTAT 80 MG PO TABS: 80.0000 mg | ORAL_TABLET | Freq: Every day | ORAL | 1 refills | Status: AC

## 2024-10-02 NOTE — Progress Notes (Signed)
 Office Visit Note  Patient: Richard Berger             Date of Birth: [birth year]           MRN: [id number]             PCP: Richard Bare, MD Referring: Richard Bare, MD Visit Date: 10/02/2024   Subjective:  Medical Management of Chronic Issues (Swelling in the left leg started about 2 days ago. )   Discussed the use of AI scribe software for clinical note transcription with the patient, who gave verbal consent to proceed.  History of Present Illness   Richard Berger is a 48 year old male with gout who presents with generalized swelling and pain in multiple joints but particularly left leg and worst at the 1st MTP and medial left foot.SABRA  He has been experiencing generalized swelling and severe pain for the past three days, affecting multiple areas of his body. The pain is severe, and previous episodes were managed with prednisone , which effectively reduced the swelling.  He is currently taking colchicine , but it has not been effective in managing the current flare-up. He also takes Uloric  (febuxostat ) 80 mg daily as a preventative measure for gout, although he has experienced recurrent inflammation when not on the medication and has not been on it consistently.  He has stopped drinking beer, which he previously enjoyed, in an effort to manage his condition. Despite these efforts, he is unsure why the flare-ups continue to occur. The condition has impacted his ability to enjoy activities, such as Thanksgiving.  Previous HPI 04/27/24 Richard Berger is a 48 y.o. male here for follow up for chronic gouty arthritis on Uloric  80 mg daily and colchicine  0.6 mg as needed usually takes this most days of the week but alternate sometimes with the 800 mg ibuprofen.  Currently he is out of his medications and symptoms are in exacerbation.   He has been experiencing swelling in his knees for approximately one month, which makes it difficult to put on socks. The pain is mild, intermittent, and  occurs particularly in the mornings.   He has a history of gout and has been on colchicine  for management. He feels that the medication is no longer effective and has been using over-the-counter ibuprofen for pain relief.    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 This physical activity may exacerbate his symptoms.     Previous HPI 08/05/2023 Richard Berger is a 48 y.o. male here for follow up for chronic gouty arthritis on Uloric  80 mg daily and colchicine  0.6 mg as needed usually takes this most days of the week but alternate sometimes with the 800 mg ibuprofen.  We last saw him on August 13 due to flareup with right ankle pain and effusion treated with tibiotalar joint steroid injection and symptoms improved.  He is now back for his planned follow-up for recheck of gout treatment previous uric acid level was 10.0 in May.  He was recommended to try increasing the febuxostat  but felt taking the increased dose all at the same time giving him a drowsy or groggy sensation and was usually forgetting the half tablet if he split the dose.  Currently has a new flareup with pain and swelling intensely on the medial side of his left foot just below the ankle.     Previous HPI 04/01/23 Richard Berger is a 48 y.o. male here for follow up  for chronic gout on Uloric  80 mg daily and colchicine  0.6 mg as needed.  It has been about a year since we followed up symptoms have mostly been well-controlled reports at least 2 flareups with pain and swelling involving the left foot.  Currently he has some inflammation this started about 2 weeks ago and has partially improved since onset.  Prior to this had another episode about 7 or 8 months prior that was treated with the colchicine .  He has not had persistent daily symptoms outside of these besides mild use related pain that normally does not have much associated swelling.   Previous HPI 12/18/21 Richard Berger is a 48 y.o. male here  for follow up for chronic gout on uloric  80 mg daily and colchicine  0.6 mg daily. He has left foot pain and swelling ongoing for about 3 days. This started at the great toe but involving the medial side of the foot all the way to the ankle now and somewhat on plantar side. He is taking the colchicine  which usually works for his flares but not resolved yet. He is interested in a steroid shot to resolve the current inflammation. He does have some pain on the bottom of the heel and foot pretty frequently that he attributes to walking on concrete surfaces for 10 hour shifts.   Previous HPI 09/18/21 Richard Berger is a 48 y.o. male here for follow up for chronic gout after increasing uloric  to 80 mg daily and colchicine  0.6 mg daily. Symptoms are well controlled without any major flares or attacks. He has developed some swelling around the left ankle not painful. No side effects noticeable with the medication increase.    Previous HPI 05/20/21 Richard Berger is a 48 y.o. male here for recurrent gout and positive ANA. His primary care office has seen him for podagra treated with prednisone  and colchicine  tablets.  He was also started apparently on Uloric  40 mg p.o. daily for hyperuricemia but states he is not taking the medicine since a few days ago.  He has had continued joint pain in his hands and knees and feet the most recent active swelling and severe pain was in the right great toe.  He has had infrequent swelling of the left great toe and has had involvement of bilateral knees he denies any obvious episodes of swelling inflammation involving the upper extremities.  From his history it is not very clear his exact previous treatments he is taken multiple medications including diclofenac , prednisone , colchicine , and topical Voltaren  for joint inflammation but is currently off the colchicine .  He reports typically noticing improvement while taking this but rapid return of symptoms after  discontinuation.   Review of Systems  Constitutional:  Negative for fatigue.  HENT:  Positive for mouth dryness. Negative for mouth sores.   Eyes:  Negative for dryness.  Respiratory:  Negative for shortness of breath.   Cardiovascular:  Positive for swelling in legs/feet. Negative for chest pain and palpitations.  Gastrointestinal:  Negative for blood in stool, constipation and diarrhea.  Endocrine: Negative for increased urination.  Genitourinary:  Negative for involuntary urination.  Musculoskeletal:  Positive for joint pain, joint pain, joint swelling, muscle weakness, morning stiffness and muscle tenderness. Negative for gait problem, myalgias and myalgias.  Skin:  Negative for color change, rash, hair loss and sensitivity to sunlight.  Allergic/Immunologic: Negative for susceptible to infections.  Neurological:  Positive for headaches. Negative for dizziness.  Hematological:  Negative for swollen glands.  Psychiatric/Behavioral:  Negative for depressed mood and sleep disturbance. The patient is not nervous/anxious.     PMFS History:  Patient Active Problem List   Diagnosis Date Noted   Plantar fasciitis 12/18/2021   Chronic gouty arthritis 12/18/2021   Chronic idiopathic constipation 12/18/2021   Gastroesophageal reflux disease 12/18/2021   Internal hemorrhoids 12/18/2021   Lumbar spondylosis 12/18/2021   Mild persistent asthma, uncomplicated 12/18/2021   Other seasonal allergic rhinitis 12/18/2021   Tobacco abuse 12/18/2021   Vitamin D  deficiency 12/18/2021   Bilateral hand pain 05/20/2021   Acute bronchitis 12/01/2016   Pleurisy 12/01/2016   OSA (obstructive sleep apnea) 05/06/2016   Erectile dysfunction 05/06/2016   Morbid obesity (HCC) 05/02/2015   Obesity 01/17/2015   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 No pertinent family history. Past Surgical History:   Procedure Laterality Date   EYE SURGERY Right    Cataract   Social History   Social History Narrative   Marital status: single; dating. Moved from [place] to [place]  in [place] with his father.  Family (1 brother and 1 sister) in [place]. Mother died there 01-24-2013.      Children: one son in [place].      Employment: employed      Tobacco: 1-2 cigarettes a week.      Alcohol: alcohol on weekends.        Immunization History  Administered Date(s) Administered   Influenza, Seasonal, Injecte, Preservative Fre 08/31/2018, 08/02/2019   Moderna Sars-Covid-2 Vaccination 02/13/2020, 03/12/2020, 08/14/2021   Tdap 04/28/2014     Objective: Vital Signs: BP 132/85   Pulse 99   Temp 98.2 F (36.8 C)   Resp 16   Ht 5' 5 (1.651 m)   Wt 281 lb 12.8 oz (127.8 kg)   BMI 46.89 kg/m    Physical Exam Constitutional:      Appearance: He is obese.  Eyes:     Conjunctiva/sclera: Conjunctivae normal.  Cardiovascular:     Rate and Rhythm: Normal rate and regular rhythm.  Pulmonary:     Effort: Pulmonary effort is normal.     Breath sounds: Normal breath sounds.  Lymphadenopathy:     Cervical: No cervical adenopathy.  Skin:    General: Skin is warm and dry.     Comments: Bilateral pitting pedal edema less than halfway up shins  Neurological:     Mental Status: He is alert.  Psychiatric:        Mood and Affect: Mood normal.      Musculoskeletal Exam: Elbows full ROM no tenderness or swelling Wrists full ROM no tenderness or swelling Fingers full ROM no tenderness or swelling Knees full ROM no tenderness or swelling Left ankle and foot swelling, worse along the medial midfoot and the first MTP joint   Investigation: No additional findings.  Imaging: No results found.  Recent Labs: Lab Results  Component Value Date   WBC 11.7 (H) 05/20/2021   HGB 15.7 05/20/2021   PLT 364 05/20/2021   NA 138 10/02/2024   K 4.4 10/02/2024   CL 101 10/02/2024   CO2 29  10/02/2024   GLUCOSE 89 10/02/2024   BUN 10 10/02/2024   CREATININE 0.87 10/02/2024   BILITOT 0.3 10/02/2024   ALKPHOS 76 08/19/2018   AST 21 10/02/2024   ALT 30 10/02/2024   PROT 7.1 10/02/2024  ALBUMIN 4.2 08/19/2018   CALCIUM 9.7 10/02/2024   GFRAA 108 08/19/2018    Speciality Comments: No specialty comments available.  Procedures:  No procedures performed Allergies: Patient has no known allergies.   Assessment / Plan:     Visit Diagnoses: Idiopathic gout of right foot, unspecified chronicity - Plan: predniSONE  (DELTASONE ) 20 MG tablet, Febuxostat  80 MG TABS, colchicine  (COLCRYS ) 0.6 MG tablet, Sedimentation rate, Uric acid, Comprehensive metabolic panel with GFR  Chronic gouty arthritis - Plan: predniSONE  (DELTASONE ) 20 MG tablet, Febuxostat  80 MG TABS, colchicine  (COLCRYS ) 0.6 MG tablet Acute gout flare with significant joint swelling and pain. Colchicine  insufficient. Hyperuricemia likely contributing. Inconsistent Uloric  use noted probably the biggest issue, followed by dietary indiscretions. - Prescribed prednisone  60 mg daily for 6 days, taper to 40 mg, 20 mg, then discontinue. - Continue colchicine  as needed for acute symptoms. - Prescribed Uloric  (febuxostat ) 80 mg daily for long-term management. - Ordered blood tests for uric acid levels and inflammation markers.  Monitoring for renal impairment Renal function monitoring required due to potential impairment from gout and medications. No renal tests in a year. - Ordered CMP for medication monitoring on long term colchicine  and uloric  use        Orders: Orders Placed This Encounter  Procedures   Sedimentation rate   Uric acid   Comprehensive metabolic panel with GFR   Meds ordered this encounter  Medications   predniSONE  (DELTASONE ) 20 MG tablet    Sig: Take 3 tablets (60 mg total) by mouth daily with breakfast for 2 days, THEN 2 tablets (40 mg total) daily with breakfast for 2 days, THEN 1 tablet (20 mg  total) daily with breakfast for 2 days.    Dispense:  12 tablet    Refill:  0   Febuxostat  80 MG TABS    Sig: Take 1 tablet (80 mg total) by mouth daily.    Dispense:  90 tablet    Refill:  1   colchicine  (COLCRYS ) 0.6 MG tablet    Sig: Take 1 tablet (0.6 mg total) by mouth daily as needed.    Dispense:  90 tablet    Refill:  1     Follow-Up Instructions: Return in about 6 months (around 04/01/2025) for Gout on uloric /colchicine  f/u 5mo.   Lonni LELON Ester, MD  Note - This record has been created using [employer].  Chart creation errors have been sought, but may not always  have been located. Such creation errors do not reflect on  the standard of medical care.

## 2024-10-02 NOTE — Patient Instructions (Signed)
 Take prednisone  20 mg 3 tablets today and Wednesday, 2 tablets Thursday and Friday, and 1 tablet Saturday and Sunday.  Then resume uloric  80 mg once daily and colchicine  once daily. If symptoms are doing well you can start just taking the colchicine  as needed but sitay on the uloric .

## 2024-11-02 ENCOUNTER — Ambulatory Visit: Admitting: Internal Medicine

## 2024-11-12 NOTE — Progress Notes (Signed)
 "  Office Visit Note  Patient: Richard Berger             Date of Birth: 08-Jun-1976           MRN: [id number]             PCP: Catalina Bare, MD Referring: Catalina Bare, MD Visit Date: 11/13/2024   Subjective:  Discussed the use of AI scribe software for clinical note transcription with the patient, who gave verbal consent to proceed.  History of Present Illness   Richard Berger is a 49 y.o. male here for follow up presents with generalized swelling and pain in multiple joints but particularly left leg and worst at the 1st MTP and medial left foot.SABRA   He has experienced a sudden onset of severe knee pain and swelling starting on Saturday, persisting for three days. The pain is primarily located in the knee, with associated swelling and difficulty in straightening the knee. He describes the knee as 'very swollen' and 'hot'.  He has been unable to sleep for three days due to the pain and swelling. He reports that 'nothing is going down'.  He is currently taking Uloric  80 mg daily and colchicine  as needed for gout but has not taken prednisone  since the last prescription was given. He requests to be excused from work for two days due to his symptoms.   Previous HPI 10/02/2024 Richard Berger is a 49 year old male with gout who presents with generalized swelling and pain in multiple joints but particularly left leg and worst at the 1st MTP and medial left foot.SABRA   He has been experiencing generalized swelling and severe pain for the past three days, affecting multiple areas of his body. The pain is severe, and previous episodes were managed with prednisone , which effectively reduced the swelling.   He is currently taking colchicine , but it has not been effective in managing the current flare-up. He also takes Uloric  (febuxostat ) 80 mg daily as a preventative measure for gout, although he has experienced recurrent inflammation when not on the medication and has not been on it  consistently.   He has stopped drinking beer, which he previously enjoyed, in an effort to manage his condition. Despite these efforts, he is unsure why the flare-ups continue to occur. The condition has impacted his ability to enjoy activities, such as Thanksgiving.   Previous HPI 04/27/24 Richard Berger is a 49 y.o. male here for follow up for chronic gouty arthritis on Uloric  80 mg daily and colchicine  0.6 mg as needed usually takes this most days of the week but alternate sometimes with the 800 mg ibuprofen.  Currently he is out of his medications and symptoms are in exacerbation.   He has been experiencing swelling in his knees for approximately one month, which makes it difficult to put on socks. The pain is mild, intermittent, and occurs particularly in the mornings.   He has a history of gout and has been on colchicine  for management. He feels that the medication is no longer effective and has been using over-the-counter ibuprofen for pain relief.    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 This physical activity may exacerbate his symptoms.     Previous HPI 08/05/2023 Richard Berger is a 49 y.o. male here for follow up for chronic gouty arthritis on Uloric  80 mg daily and colchicine  0.6 mg as needed usually takes this most days of the week  but alternate sometimes with the 800 mg ibuprofen.  We last saw him on August 13 due to flareup with right ankle pain and effusion treated with tibiotalar joint steroid injection and symptoms improved.  He is now back for his planned follow-up for recheck of gout treatment previous uric acid level was 10.0 in May.  He was recommended to try increasing the febuxostat  but felt taking the increased dose all at the same time giving him a drowsy or groggy sensation and was usually forgetting the half tablet if he split the dose.  Currently has a new flareup with pain and swelling intensely on the medial side of his left foot  just below the ankle.     Previous HPI 04/01/23 Richard Berger is a 49 y.o. male here for follow up for chronic gout on Uloric  80 mg daily and colchicine  0.6 mg as needed.  It has been about a year since we followed up symptoms have mostly been well-controlled reports at least 2 flareups with pain and swelling involving the left foot.  Currently he has some inflammation this started about 2 weeks ago and has partially improved since onset.  Prior to this had another episode about 7 or 8 months prior that was treated with the colchicine .  He has not had persistent daily symptoms outside of these besides mild use related pain that normally does not have much associated swelling.   Previous HPI 12/18/21 Richard Berger is a 49 y.o. male here for follow up for chronic gout on uloric  80 mg daily and colchicine  0.6 mg daily. He has left foot pain and swelling ongoing for about 3 days. This started at the great toe but involving the medial side of the foot all the way to the ankle now and somewhat on plantar side. He is taking the colchicine  which usually works for his flares but not resolved yet. He is interested in a steroid shot to resolve the current inflammation. He does have some pain on the bottom of the heel and foot pretty frequently that he attributes to walking on concrete surfaces for 10 hour shifts.   Previous HPI 09/18/21 Richard Berger is a 49 y.o. male here for follow up for chronic gout after increasing uloric  to 80 mg daily and colchicine  0.6 mg daily. Symptoms are well controlled without any major flares or attacks. He has developed some swelling around the left ankle not painful. No side effects noticeable with the medication increase.    Previous HPI 05/20/21 Richard Berger is a 49 y.o. male here for recurrent gout and positive ANA. His primary care office has seen him for podagra treated with prednisone  and colchicine  tablets.  He was also started apparently on Uloric  40 mg p.o. daily for  hyperuricemia but states he is not taking the medicine since a few days ago.  He has had continued joint pain in his hands and knees and feet the most recent active swelling and severe pain was in the right great toe.  He has had infrequent swelling of the left great toe and has had involvement of bilateral knees he denies any obvious episodes of swelling inflammation involving the upper extremities.  From his history it is not very clear his exact previous treatments he is taken multiple medications including diclofenac , prednisone , colchicine , and topical Voltaren  for joint inflammation but is currently off the colchicine .  He reports typically noticing improvement while taking this but rapid return of symptoms after discontinuation   Review  of Systems  Constitutional:  Positive for fatigue.  HENT:  Positive for mouth dryness. Negative for mouth sores.   Eyes:  Negative for dryness.  Respiratory:  Negative for shortness of breath.   Cardiovascular:  Negative for chest pain and palpitations.  Gastrointestinal:  Negative for blood in stool, constipation and diarrhea.  Endocrine: Negative for increased urination.  Genitourinary:  Negative for involuntary urination.  Musculoskeletal:  Positive for joint pain, gait problem, joint pain, joint swelling, muscle weakness and morning stiffness. Negative for myalgias, muscle tenderness and myalgias.  Skin:  Negative for color change, rash, hair loss and sensitivity to sunlight.  Allergic/Immunologic: Negative for susceptible to infections.  Neurological:  Positive for headaches. Negative for dizziness.  Hematological:  Negative for swollen glands.  Psychiatric/Behavioral:  Positive for sleep disturbance. Negative for depressed mood. The patient is not nervous/anxious.     PMFS History:  Patient Active Problem List   Diagnosis Date Noted   Effusion, left knee 11/13/2024   Plantar fasciitis 12/18/2021   Chronic gouty arthritis 12/18/2021   Chronic  idiopathic constipation 12/18/2021   Gastroesophageal reflux disease 12/18/2021   Internal hemorrhoids 12/18/2021   Lumbar spondylosis 12/18/2021   Mild persistent asthma, uncomplicated 12/18/2021   Other seasonal allergic rhinitis 12/18/2021   Tobacco abuse 12/18/2021   Vitamin D  deficiency 12/18/2021   Bilateral hand pain 05/20/2021   Acute bronchitis 12/01/2016   Pleurisy 12/01/2016   OSA (obstructive sleep apnea) 05/06/2016   Erectile dysfunction 05/06/2016   Morbid obesity (HCC) 05/02/2015   Obesity 01/17/2015   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 No pertinent family history. Past Surgical History:  Procedure Laterality Date   EYE SURGERY Right    Cataract   Social History   Social History Narrative   Marital status: single; dating. Moved from [place] to [place]  in 2001 with his father.  Family (1 brother and 1 sister) in [place]. Mother died there January 27, 2013.      Children: one son in [place].      Employment: employed      Tobacco: 1-2 cigarettes a week.      Alcohol: alcohol on weekends.        Immunization History  Administered Date(s) Administered   Influenza, Seasonal, Injecte, Preservative Fre 08/31/2018, 08/02/2019   Moderna Sars-Covid-2 Vaccination 02/13/2020, 03/12/2020, 08/14/2021   Tdap 04/28/2014     Objective: Vital Signs: BP 117/77   Pulse (!) 105   Temp 98.4 F (36.9 C)   Resp 17   Ht 5' 5 (1.651 m)   Wt 281 lb (127.5 kg)   BMI 46.76 kg/m    Physical Exam Constitutional:      Appearance: He is obese.  Eyes:     Conjunctiva/sclera: Conjunctivae normal.  Cardiovascular:     Rate and Rhythm: Normal rate and regular rhythm.  Pulmonary:     Effort: Pulmonary effort is normal.     Breath sounds: Normal breath sounds.  Skin:    General: Skin is warm and dry.     Findings: No rash.  Neurological:     Mental Status: He is  alert.  Psychiatric:        Mood and Affect: Mood normal.      Musculoskeletal Exam:  Elbows full ROM no tenderness or swelling Wrists full ROM no tenderness or swelling Fingers full ROM no  tenderness or swelling Left knee tenderness to pressure, large palpable effusion, wamrth to touch Left ankle and foot swelling, worse along the medial midfoot and the first MTP joint    Investigation: No additional findings.  Imaging: No results found.  Recent Labs: Lab Results  Component Value Date   WBC 11.7 (H) 05/20/2021   HGB 15.7 05/20/2021   PLT 364 05/20/2021   NA 138 10/02/2024   K 4.4 10/02/2024   CL 101 10/02/2024   CO2 29 10/02/2024   GLUCOSE 89 10/02/2024   BUN 10 10/02/2024   CREATININE 0.87 10/02/2024   BILITOT 0.3 10/02/2024   ALKPHOS 76 08/19/2018   AST 21 10/02/2024   ALT 30 10/02/2024   PROT 7.1 10/02/2024   ALBUMIN 4.2 08/19/2018   CALCIUM 9.7 10/02/2024   GFRAA 108 08/19/2018    Speciality Comments: No specialty comments available.  Procedures:  Large Joint Inj: L knee on 11/13/2024 9:20 AM Indications: pain, joint swelling and diagnostic evaluation Details: 22 G 1.5 in needle, superolateral approach Medications: 2 mL lidocaine  1 %; 40 mg triamcinolone  acetonide 40 MG/ML Aspirate: 94 mL yellow and clear; sent for lab analysis Outcome: tolerated well, no immediate complications Procedure, treatment alternatives, risks and benefits explained, specific risks discussed. Consent was given by the patient. Immediately prior to procedure a time out was called to verify the correct patient, procedure, equipment, support staff and site/side marked as required. Patient was prepped and draped in the usual sterile fashion.     Allergies: Patient has no known allergies.   Assessment / Plan:     Visit Diagnoses: Chronic gouty arthritis - Plan: Synovial Fluid Analysis, Complete Effusion, left knee - Plan: Synovial Fluid Analysis, Complete Chronic gout with acute  flare of the left knee Acute flare with significant swelling, pain, and warmth despite Uloric  and colchicine . Possible inadequate control or underlying issue.  - Performed knee aspiration to assess for infection or other causes. - Administered steroid injection to reduce inflammation. - Advised rest and elevation of the leg. - Provided work excuse for two days.  - Continue uloric  80 mg daily - Colchicine  0.6 mg daily ppx until no flares more than 1 month - If synovial fluid inflammatory repeat oral prednisone  taper as well, if not consider orthopedic evaluation    Orders: Orders Placed This Encounter  Procedures   Large Joint Inj   Synovial Fluid Analysis, Complete   No orders of the defined types were placed in this encounter.    Follow-Up Instructions: No follow-ups on file.   Lonni LELON Ester, MD  Note - This record has been created using [employer].  Chart creation errors have been sought, but may not always  have been located. Such creation errors do not reflect on  the standard of medical care. "

## 2024-11-13 ENCOUNTER — Ambulatory Visit: Attending: Internal Medicine | Admitting: Internal Medicine

## 2024-11-13 ENCOUNTER — Ambulatory Visit: Payer: Self-pay | Admitting: Internal Medicine

## 2024-11-13 ENCOUNTER — Encounter: Payer: Self-pay | Admitting: Internal Medicine

## 2024-11-13 VITALS — BP 117/77 | HR 105 | Temp 98.4°F | Resp 17 | Ht 65.0 in | Wt 281.0 lb

## 2024-11-13 DIAGNOSIS — M1A9XX0 Chronic gout, unspecified, without tophus (tophi): Secondary | ICD-10-CM

## 2024-11-13 DIAGNOSIS — M25462 Effusion, left knee: Secondary | ICD-10-CM

## 2024-11-13 DIAGNOSIS — M10071 Idiopathic gout, right ankle and foot: Secondary | ICD-10-CM

## 2024-11-13 LAB — SYNOVIAL FLUID ANALYSIS, COMPLETE
Basophils, %: 0 %
Eosinophils-Synovial: 0 % (ref 0–2)
Lymphocytes-Synovial Fld: 4 % (ref 0–74)
Monocyte/Macrophage: 2 % (ref 0–69)
Neutrophil, Synovial: 94 % — ABNORMAL HIGH (ref 0–24)
Synoviocytes, %: 0 % (ref 0–15)
WBC, Synovial: 16790 {cells}/uL — ABNORMAL HIGH

## 2024-11-13 MED ORDER — PREDNISONE 10 MG PO TABS: ORAL_TABLET | ORAL | 0 refills | Status: AC

## 2024-11-13 NOTE — Progress Notes (Signed)
 Knee fluid analysis shows 16,790 white blood cells indicating a high level of inflammation but no sign of infection or trauma. I am sending a prescription for a prednisone  taper he can start tomorrow for this. He should remain on the 80 mg uloric  consistently as well.

## 2024-12-02 MED ORDER — LIDOCAINE HCL 1 % IJ SOLN
2.0000 mL | INTRAMUSCULAR | Status: AC | PRN
  Administered 2024-11-13: 2 mL

## 2024-12-02 MED ORDER — TRIAMCINOLONE ACETONIDE 40 MG/ML IJ SUSP
40.0000 mg | INTRAMUSCULAR | Status: AC | PRN
  Administered 2024-11-13: 40 mg via INTRA_ARTICULAR

## 2025-05-17 ENCOUNTER — Ambulatory Visit: Payer: Self-pay | Admitting: Internal Medicine
